# Patient Record
Sex: Female | Born: 1985 | Race: Black or African American | Hispanic: No | Marital: Single | Smoking: Former smoker
Health system: Southern US, Community
[De-identification: ages and names within clinical notes are randomized; demographics above are authoritative.]

## PROBLEM LIST (undated history)

## (undated) DIAGNOSIS — F419 Anxiety disorder, unspecified: Secondary | ICD-10-CM

---
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
## 2003-11-06 ENCOUNTER — Emergency Department (HOSPITAL_COMMUNITY): Admission: EM | Admit: 2003-11-06 | Discharge: 2003-11-06 | Payer: Self-pay | Admitting: Family Medicine

## 2004-01-25 ENCOUNTER — Emergency Department (HOSPITAL_COMMUNITY): Admission: EM | Admit: 2004-01-25 | Discharge: 2004-01-25 | Payer: Self-pay | Admitting: Emergency Medicine

## 2005-06-17 ENCOUNTER — Emergency Department (HOSPITAL_COMMUNITY): Admission: EM | Admit: 2005-06-17 | Discharge: 2005-06-17 | Payer: Self-pay | Admitting: Family Medicine

## 2005-07-17 ENCOUNTER — Emergency Department (HOSPITAL_COMMUNITY): Admission: EM | Admit: 2005-07-17 | Discharge: 2005-07-17 | Payer: Self-pay | Admitting: Family Medicine

## 2005-08-22 ENCOUNTER — Emergency Department (HOSPITAL_COMMUNITY): Admission: EM | Admit: 2005-08-22 | Discharge: 2005-08-22 | Payer: Self-pay | Admitting: Emergency Medicine

## 2005-09-11 ENCOUNTER — Emergency Department (HOSPITAL_COMMUNITY): Admission: EM | Admit: 2005-09-11 | Discharge: 2005-09-11 | Payer: Self-pay | Admitting: Family Medicine

## 2005-09-22 ENCOUNTER — Emergency Department (HOSPITAL_COMMUNITY): Admission: EM | Admit: 2005-09-22 | Discharge: 2005-09-22 | Payer: Self-pay | Admitting: Family Medicine

## 2006-06-01 ENCOUNTER — Emergency Department (HOSPITAL_COMMUNITY): Admission: EM | Admit: 2006-06-01 | Discharge: 2006-06-01 | Payer: Self-pay | Admitting: Emergency Medicine

## 2006-06-29 ENCOUNTER — Emergency Department (HOSPITAL_COMMUNITY): Admission: EM | Admit: 2006-06-29 | Discharge: 2006-06-29 | Payer: Self-pay | Admitting: Family Medicine

## 2006-08-03 ENCOUNTER — Emergency Department (HOSPITAL_COMMUNITY): Admission: EM | Admit: 2006-08-03 | Discharge: 2006-08-03 | Payer: Self-pay | Admitting: *Deleted

## 2007-04-12 ENCOUNTER — Emergency Department (HOSPITAL_COMMUNITY): Admission: EM | Admit: 2007-04-12 | Discharge: 2007-04-12 | Payer: Self-pay | Admitting: Emergency Medicine

## 2007-04-19 ENCOUNTER — Inpatient Hospital Stay (HOSPITAL_COMMUNITY): Admission: AD | Admit: 2007-04-19 | Discharge: 2007-04-19 | Payer: Self-pay | Admitting: Family Medicine

## 2007-07-01 HISTORY — PX: DILATION AND CURETTAGE OF UTERUS: SHX78

## 2008-05-21 ENCOUNTER — Emergency Department (HOSPITAL_COMMUNITY): Admission: EM | Admit: 2008-05-21 | Discharge: 2008-05-22 | Payer: Self-pay | Admitting: Emergency Medicine

## 2009-03-01 ENCOUNTER — Emergency Department (HOSPITAL_COMMUNITY): Admission: EM | Admit: 2009-03-01 | Discharge: 2009-03-01 | Payer: Self-pay | Admitting: Emergency Medicine

## 2009-07-15 ENCOUNTER — Emergency Department (HOSPITAL_COMMUNITY): Admission: EM | Admit: 2009-07-15 | Discharge: 2009-07-15 | Payer: Self-pay | Admitting: Emergency Medicine

## 2010-05-29 ENCOUNTER — Emergency Department (HOSPITAL_COMMUNITY)
Admission: EM | Admit: 2010-05-29 | Discharge: 2010-05-29 | Payer: Self-pay | Source: Home / Self Care | Admitting: Family Medicine

## 2010-07-21 ENCOUNTER — Encounter: Payer: Self-pay | Admitting: Emergency Medicine

## 2010-09-10 LAB — POCT URINALYSIS DIPSTICK
Ketones, ur: 15 mg/dL — AB
Protein, ur: 30 mg/dL — AB
Specific Gravity, Urine: 1.02 (ref 1.005–1.030)

## 2010-09-10 LAB — WET PREP, GENITAL: Yeast Wet Prep HPF POC: NONE SEEN

## 2010-09-10 LAB — POCT I-STAT, CHEM 8
BUN: 6 mg/dL (ref 6–23)
Calcium, Ion: 1.15 mmol/L (ref 1.12–1.32)
Chloride: 105 mEq/L (ref 96–112)
Glucose, Bld: 90 mg/dL (ref 70–99)

## 2010-09-10 LAB — POCT PREGNANCY, URINE: Preg Test, Ur: NEGATIVE

## 2010-09-10 LAB — GC/CHLAMYDIA PROBE AMP, GENITAL: GC Probe Amp, Genital: NEGATIVE

## 2010-09-10 LAB — RPR: RPR Ser Ql: NONREACTIVE

## 2010-09-23 ENCOUNTER — Inpatient Hospital Stay (INDEPENDENT_AMBULATORY_CARE_PROVIDER_SITE_OTHER)
Admission: RE | Admit: 2010-09-23 | Discharge: 2010-09-23 | Disposition: A | Discharge status: Home / Self Care | Payer: Self-pay | Source: Ambulatory Visit | Attending: Family Medicine | Admitting: Family Medicine

## 2010-09-23 DIAGNOSIS — K089 Disorder of teeth and supporting structures, unspecified: Secondary | ICD-10-CM

## 2010-09-23 DIAGNOSIS — N760 Acute vaginitis: Secondary | ICD-10-CM

## 2010-09-23 LAB — POCT URINALYSIS DIP (DEVICE)
Protein, ur: NEGATIVE mg/dL
Specific Gravity, Urine: 1.02 (ref 1.005–1.030)
Urobilinogen, UA: 0.2 mg/dL (ref 0.0–1.0)
pH: 6 (ref 5.0–8.0)

## 2010-09-23 LAB — WET PREP, GENITAL

## 2010-09-24 LAB — GC/CHLAMYDIA PROBE AMP, GENITAL: GC Probe Amp, Genital: NEGATIVE

## 2010-10-04 LAB — POCT I-STAT, CHEM 8
BUN: 8 mg/dL (ref 6–23)
Calcium, Ion: 1.15 mmol/L (ref 1.12–1.32)
HCT: 52 % — ABNORMAL HIGH (ref 36.0–46.0)
TCO2: 23 mmol/L (ref 0–100)

## 2010-10-04 LAB — POCT PREGNANCY, URINE: Preg Test, Ur: NEGATIVE

## 2010-10-04 LAB — TSH: TSH: 0.365 u[IU]/mL (ref 0.350–4.500)

## 2010-12-02 ENCOUNTER — Inpatient Hospital Stay (INDEPENDENT_AMBULATORY_CARE_PROVIDER_SITE_OTHER)
Admission: RE | Admit: 2010-12-02 | Discharge: 2010-12-02 | Disposition: A | Discharge status: Home / Self Care | Payer: Self-pay | Source: Ambulatory Visit | Attending: Family Medicine | Admitting: Family Medicine

## 2010-12-02 DIAGNOSIS — N946 Dysmenorrhea, unspecified: Secondary | ICD-10-CM

## 2010-12-02 LAB — POCT I-STAT, CHEM 8
Calcium, Ion: 1.2 mmol/L (ref 1.12–1.32)
Creatinine, Ser: 0.9 mg/dL (ref 0.4–1.2)
Hemoglobin: 19.4 g/dL — ABNORMAL HIGH (ref 12.0–15.0)
Sodium: 141 mEq/L (ref 135–145)
TCO2: 23 mmol/L (ref 0–100)

## 2010-12-02 LAB — POCT URINALYSIS DIP (DEVICE)
Glucose, UA: NEGATIVE mg/dL
Nitrite: NEGATIVE
Protein, ur: 30 mg/dL — AB
Urobilinogen, UA: 0.2 mg/dL (ref 0.0–1.0)

## 2010-12-04 LAB — URINE CULTURE

## 2011-02-01 ENCOUNTER — Inpatient Hospital Stay (INDEPENDENT_AMBULATORY_CARE_PROVIDER_SITE_OTHER)
Admission: RE | Admit: 2011-02-01 | Discharge: 2011-02-01 | Disposition: A | Discharge status: Home / Self Care | Payer: Self-pay | Source: Ambulatory Visit | Attending: Emergency Medicine | Admitting: Emergency Medicine

## 2011-02-01 DIAGNOSIS — K089 Disorder of teeth and supporting structures, unspecified: Secondary | ICD-10-CM

## 2011-02-01 DIAGNOSIS — K029 Dental caries, unspecified: Secondary | ICD-10-CM

## 2011-02-01 DIAGNOSIS — K122 Cellulitis and abscess of mouth: Secondary | ICD-10-CM

## 2011-02-01 DIAGNOSIS — K0510 Chronic gingivitis, plaque induced: Secondary | ICD-10-CM

## 2011-02-03 ENCOUNTER — Inpatient Hospital Stay (INDEPENDENT_AMBULATORY_CARE_PROVIDER_SITE_OTHER)
Admission: RE | Admit: 2011-02-03 | Discharge: 2011-02-03 | Disposition: A | Discharge status: Home / Self Care | Payer: Self-pay | Source: Ambulatory Visit | Attending: Emergency Medicine | Admitting: Emergency Medicine

## 2011-02-03 DIAGNOSIS — K047 Periapical abscess without sinus: Secondary | ICD-10-CM

## 2011-03-14 ENCOUNTER — Inpatient Hospital Stay (INDEPENDENT_AMBULATORY_CARE_PROVIDER_SITE_OTHER)
Admission: RE | Admit: 2011-03-14 | Discharge: 2011-03-14 | Disposition: A | Discharge status: Home / Self Care | Payer: Self-pay | Source: Ambulatory Visit | Attending: Emergency Medicine | Admitting: Emergency Medicine

## 2011-03-14 DIAGNOSIS — Z3201 Encounter for pregnancy test, result positive: Secondary | ICD-10-CM

## 2011-03-14 LAB — HCG, SERUM, QUALITATIVE: Preg, Serum: POSITIVE — AB

## 2011-03-17 LAB — POCT PREGNANCY, URINE: Preg Test, Ur: POSITIVE

## 2011-03-25 ENCOUNTER — Inpatient Hospital Stay (HOSPITAL_COMMUNITY): Payer: Self-pay

## 2011-03-25 ENCOUNTER — Encounter (HOSPITAL_COMMUNITY): Payer: Self-pay | Admitting: *Deleted

## 2011-03-25 ENCOUNTER — Inpatient Hospital Stay (HOSPITAL_COMMUNITY)
Admission: AD | Admit: 2011-03-25 | Discharge: 2011-03-25 | Disposition: A | Discharge status: Home / Self Care | Payer: Medicaid Other | Source: Ambulatory Visit | Attending: Obstetrics & Gynecology | Admitting: Obstetrics & Gynecology

## 2011-03-25 DIAGNOSIS — O26899 Other specified pregnancy related conditions, unspecified trimester: Secondary | ICD-10-CM

## 2011-03-25 DIAGNOSIS — R109 Unspecified abdominal pain: Secondary | ICD-10-CM | POA: Insufficient documentation

## 2011-03-25 DIAGNOSIS — O99891 Other specified diseases and conditions complicating pregnancy: Secondary | ICD-10-CM | POA: Insufficient documentation

## 2011-03-25 HISTORY — DX: Anxiety disorder, unspecified: F41.9

## 2011-03-25 LAB — URINALYSIS, ROUTINE W REFLEX MICROSCOPIC
Glucose, UA: NEGATIVE mg/dL
Leukocytes, UA: NEGATIVE
Nitrite: NEGATIVE
Protein, ur: NEGATIVE mg/dL
Urobilinogen, UA: 0.2 mg/dL (ref 0.0–1.0)

## 2011-03-25 NOTE — ED Provider Notes (Signed)
Attestation of Attending Supervision of Advanced Practitioner: Evaluation and management procedures were performed by the PA/NP/CNM/OB Fellow under my supervision/collaboration. Chart reviewed and agree with management and plan.  Rory Montel A 03/25/2011 2:59 PM

## 2011-03-25 NOTE — ED Provider Notes (Signed)
History   Pt presents today c/o lower abd cramping. She denies vag bleeding but states she has noticed worsening cramping over the past 2 days. She denies fever or any other problems at this time.  Chief Complaint  Patient presents with  . Abdominal Pain   HPI  OB History    Grav Para Term Preterm Abortions TAB SAB Ect Mult Living   2 0 0 0 1 0 1 0 0 0       Past Medical History  Diagnosis Date  . Anxiety     on celexa    Past Surgical History  Procedure Date  . Dilation and curettage of uterus 2009    No family history on file.  History  Substance Use Topics  . Smoking status: Former Games developer  . Smokeless tobacco: Not on file  . Alcohol Use: No    Allergies: No Known Allergies  Prescriptions prior to admission  Medication Sig Dispense Refill  . citalopram (CELEXA) 10 MG tablet Take 10 mg by mouth daily.        . prenatal vitamin w/FE, FA (PRENATAL 1 + 1) 27-1 MG TABS Take 1 tablet by mouth daily.          Review of Systems  Constitutional: Negative for fever.  Cardiovascular: Negative for chest pain.  Gastrointestinal: Positive for abdominal pain. Negative for nausea, vomiting, diarrhea and constipation.  Genitourinary: Negative for dysuria, urgency and frequency.  Neurological: Negative for dizziness and headaches.  Psychiatric/Behavioral: Negative for depression and suicidal ideas.   Physical Exam   Blood pressure 114/66, pulse 79, temperature 98.3 F (36.8 C), temperature source Oral, resp. rate 20, height 5\' 5"  (1.651 m), weight 161 lb 12.8 oz (73.392 kg).  Physical Exam  Constitutional: She is oriented to person, place, and time. She appears well-developed and well-nourished. No distress.  HENT:  Head: Normocephalic and atraumatic.  Eyes: EOM are normal. Pupils are equal, round, and reactive to light.  GI: Soft. She exhibits no distension. There is no tenderness. There is no rebound and no guarding.  Genitourinary: No bleeding around the vagina. No  vaginal discharge found.  Neurological: She is alert and oriented to person, place, and time.  Skin: Skin is warm and dry. She is not diaphoretic.  Psychiatric: She has a normal mood and affect. Her behavior is normal. Judgment and thought content normal.    MAU Course  Procedures  Results for orders placed during the hospital encounter of 03/25/11 (from the past 24 hour(s))  URINALYSIS, ROUTINE W REFLEX MICROSCOPIC     Status: Abnormal   Collection Time   03/25/11 12:50 PM      Component Value Range   Color, Urine YELLOW  YELLOW    Appearance CLOUDY (*) CLEAR    Specific Gravity, Urine 1.015  1.005 - 1.030    pH 7.5  5.0 - 8.0    Glucose, UA NEGATIVE  NEGATIVE (mg/dL)   Hgb urine dipstick NEGATIVE  NEGATIVE    Bilirubin Urine NEGATIVE  NEGATIVE    Ketones, ur NEGATIVE  NEGATIVE (mg/dL)   Protein, ur NEGATIVE  NEGATIVE (mg/dL)   Urobilinogen, UA 0.2  0.0 - 1.0 (mg/dL)   Nitrite NEGATIVE  NEGATIVE    Leukocytes, UA NEGATIVE  NEGATIVE    US shows single IUP with cardiac activity.  Assessment and Plan  Pain in preg: discussed with pt at length. She will begin prenatal care. Discussed diet, activity, risks, and precautions.  Clinton Gallant. Rice III, DrHSc, MPAS, PA-C  03/25/2011, 1:31 PM   Henrietta Hoover, PA 03/25/11 1405

## 2011-03-25 NOTE — Progress Notes (Signed)
preg confirmed at health dept  On 09/19.  Cramping started 2 days ago.

## 2011-04-01 LAB — DIFFERENTIAL
Basophils Relative: 0
Eosinophils Absolute: 0.2
Monocytes Relative: 7
Neutrophils Relative %: 66

## 2011-04-01 LAB — LIPASE, BLOOD: Lipase: 16

## 2011-04-01 LAB — COMPREHENSIVE METABOLIC PANEL
ALT: 14
Alkaline Phosphatase: 73
CO2: 30
Chloride: 104
GFR calc non Af Amer: >60
Glucose, Bld: 94
Potassium: 3.7
Sodium: 137
Total Bilirubin: 0.5

## 2011-04-01 LAB — URINALYSIS, ROUTINE W REFLEX MICROSCOPIC
Glucose, UA: NEGATIVE
Ketones, ur: NEGATIVE
Protein, ur: NEGATIVE

## 2011-04-01 LAB — URINE CULTURE

## 2011-04-01 LAB — CBC
Hemoglobin: 15.1 — ABNORMAL HIGH
RBC: 4.74

## 2011-04-09 LAB — URINALYSIS, ROUTINE W REFLEX MICROSCOPIC
Glucose, UA: NEGATIVE
Nitrite: NEGATIVE
Protein, ur: NEGATIVE
Urobilinogen, UA: 0.2

## 2011-04-09 LAB — URINE MICROSCOPIC-ADD ON

## 2011-04-09 LAB — WET PREP, GENITAL: Trich, Wet Prep: NONE SEEN

## 2011-04-09 LAB — CBC
HCT: 44.4
Hemoglobin: 15
MCHC: 33.8
MCV: 94.7
RDW: 12.9

## 2011-04-09 LAB — HCG, SERUM, QUALITATIVE: Preg, Serum: NEGATIVE

## 2011-04-17 LAB — OB RESULTS CONSOLE HEPATITIS B SURFACE ANTIGEN: Hepatitis B Surface Ag: NEGATIVE

## 2011-04-17 LAB — OB RESULTS CONSOLE ANTIBODY SCREEN: Antibody Screen: NEGATIVE

## 2011-07-01 NOTE — L&D Delivery Note (Signed)
Delivery Note At 11:25 AM a viable female was delivered via Vaginal, Spontaneous Delivery (Presentation: ; Occiput Anterior).  APGAR: 9, 9; weight P .   Placenta status: Intact, Spontaneous.  Cord: 3 vessels with the following complications: None.   Anesthesia: Epidural  Episiotomy: None Lacerations: 2nd degree;Periurethral;Perineal Suture Repair: 3.0 vicryl rapide Est. Blood Loss (mL): 500  Mom to postpartum.  Baby to nursery-stable.  [name],Markitta Ausburn 11/07/2011, 11:59 AM  Br/ O+

## 2011-10-17 LAB — OB RESULTS CONSOLE GBS: GBS: POSITIVE

## 2011-11-06 ENCOUNTER — Inpatient Hospital Stay (HOSPITAL_COMMUNITY)
Admission: AD | Admit: 2011-11-06 | Discharge: 2011-11-09 | DRG: 775 | Disposition: A | Discharge status: Home / Self Care | Payer: Medicaid Other | Source: Ambulatory Visit | Attending: Obstetrics and Gynecology | Admitting: Obstetrics and Gynecology

## 2011-11-06 DIAGNOSIS — Z22330 Carrier of Group B streptococcus: Secondary | ICD-10-CM

## 2011-11-06 DIAGNOSIS — O409XX0 Polyhydramnios, unspecified trimester, not applicable or unspecified: Principal | ICD-10-CM | POA: Diagnosis present

## 2011-11-06 DIAGNOSIS — IMO0001 Reserved for inherently not codable concepts without codable children: Secondary | ICD-10-CM

## 2011-11-06 DIAGNOSIS — O99892 Other specified diseases and conditions complicating childbirth: Secondary | ICD-10-CM | POA: Diagnosis present

## 2011-11-07 ENCOUNTER — Encounter (HOSPITAL_COMMUNITY): Payer: Self-pay | Admitting: Anesthesiology

## 2011-11-07 ENCOUNTER — Inpatient Hospital Stay (HOSPITAL_COMMUNITY): Payer: Medicaid Other | Admitting: Anesthesiology

## 2011-11-07 ENCOUNTER — Encounter (HOSPITAL_COMMUNITY): Payer: Self-pay

## 2011-11-07 DIAGNOSIS — IMO0001 Reserved for inherently not codable concepts without codable children: Secondary | ICD-10-CM

## 2011-11-07 DIAGNOSIS — O409XX0 Polyhydramnios, unspecified trimester, not applicable or unspecified: Secondary | ICD-10-CM | POA: Diagnosis present

## 2011-11-07 LAB — CBC
HCT: 40.8 % (ref 36.0–46.0)
MCHC: 33.3 g/dL (ref 30.0–36.0)
MCV: 96.2 fL (ref 78.0–100.0)
RDW: 13.2 % (ref 11.5–15.5)

## 2011-11-07 MED ORDER — ZOLPIDEM TARTRATE 5 MG PO TABS: 5.0000 mg | ORAL_TABLET | Freq: Every evening | ORAL | Status: DC | PRN

## 2011-11-07 MED ORDER — LANOLIN HYDROUS EX OINT: TOPICAL_OINTMENT | CUTANEOUS | Status: DC | PRN

## 2011-11-07 MED ORDER — PHENYLEPHRINE 40 MCG/ML (10ML) SYRINGE FOR IV PUSH (FOR BLOOD PRESSURE SUPPORT)
80.0000 ug | PREFILLED_SYRINGE | INTRAVENOUS | Status: DC | PRN
  Administered 2011-11-07: 120 ug via INTRAVENOUS

## 2011-11-07 MED ORDER — ONDANSETRON HCL 4 MG PO TABS: 4.0000 mg | ORAL_TABLET | ORAL | Status: DC | PRN

## 2011-11-07 MED ORDER — EPHEDRINE 5 MG/ML INJ
10.0000 mg | INTRAVENOUS | Status: DC | PRN
  Administered 2011-11-07: 10 mg via INTRAVENOUS

## 2011-11-07 MED ORDER — SENNOSIDES-DOCUSATE SODIUM 8.6-50 MG PO TABS
2.0000 | ORAL_TABLET | Freq: Every day | ORAL | Status: DC
  Administered 2011-11-07 – 2011-11-08 (×2): 2 via ORAL

## 2011-11-07 MED ORDER — DIPHENHYDRAMINE HCL 50 MG/ML IJ SOLN: 12.5000 mg | INTRAMUSCULAR | Status: DC | PRN

## 2011-11-07 MED ORDER — FAMOTIDINE 20 MG PO TABS
20.0000 mg | ORAL_TABLET | Freq: Two times a day (BID) | ORAL | Status: DC
  Administered 2011-11-07 – 2011-11-09 (×4): 20 mg via ORAL
  Filled 2011-11-07 (×4): qty 1

## 2011-11-07 MED ORDER — LACTATED RINGERS IV SOLN
INTRAVENOUS | Status: DC
  Administered 2011-11-07: 1000 mL via INTRAVENOUS

## 2011-11-07 MED ORDER — CITRIC ACID-SODIUM CITRATE 334-500 MG/5ML PO SOLN: 30.0000 mL | ORAL | Status: DC | PRN

## 2011-11-07 MED ORDER — LIDOCAINE HCL (PF) 1 % IJ SOLN
30.0000 mL | INTRAMUSCULAR | Status: DC | PRN
  Filled 2011-11-07: qty 30

## 2011-11-07 MED ORDER — IBUPROFEN 600 MG PO TABS
600.0000 mg | ORAL_TABLET | Freq: Four times a day (QID) | ORAL | Status: DC
  Administered 2011-11-07 – 2011-11-09 (×8): 600 mg via ORAL
  Filled 2011-11-07 (×8): qty 1

## 2011-11-07 MED ORDER — OXYTOCIN 20 UNITS IN LACTATED RINGERS INFUSION - SIMPLE
125.0000 mL/h | Freq: Once | INTRAVENOUS | Status: AC
  Administered 2011-11-07: 125 mL/h via INTRAVENOUS

## 2011-11-07 MED ORDER — ONDANSETRON HCL 4 MG/2ML IJ SOLN
4.0000 mg | Freq: Four times a day (QID) | INTRAMUSCULAR | Status: DC | PRN
  Administered 2011-11-07: 4 mg via INTRAVENOUS
  Filled 2011-11-07: qty 2

## 2011-11-07 MED ORDER — EPHEDRINE 5 MG/ML INJ
10.0000 mg | INTRAVENOUS | Status: DC | PRN
  Filled 2011-11-07: qty 4

## 2011-11-07 MED ORDER — DIBUCAINE 1 % RE OINT: 1.0000 "application " | TOPICAL_OINTMENT | RECTAL | Status: DC | PRN

## 2011-11-07 MED ORDER — ONDANSETRON HCL 4 MG/2ML IJ SOLN: 4.0000 mg | INTRAMUSCULAR | Status: DC | PRN

## 2011-11-07 MED ORDER — FENTANYL 2.5 MCG/ML BUPIVACAINE 1/10 % EPIDURAL INFUSION (WH - ANES)
INTRAMUSCULAR | Status: DC | PRN
  Administered 2011-11-07: 8 mL/h via EPIDURAL

## 2011-11-07 MED ORDER — PRENATAL PLUS 27-1 MG PO TABS: 1.0000 | ORAL_TABLET | Freq: Every day | ORAL | Status: DC

## 2011-11-07 MED ORDER — LACTATED RINGERS IV SOLN: 500.0000 mL | INTRAVENOUS | Status: DC | PRN

## 2011-11-07 MED ORDER — BUTORPHANOL TARTRATE 2 MG/ML IJ SOLN
1.0000 mg | Freq: Once | INTRAMUSCULAR | Status: DC
  Filled 2011-11-07: qty 1

## 2011-11-07 MED ORDER — LACTATED RINGERS IV SOLN
500.0000 mL | Freq: Once | INTRAVENOUS | Status: AC
  Administered 2011-11-07: 500 mL via INTRAVENOUS

## 2011-11-07 MED ORDER — OXYTOCIN BOLUS FROM INFUSION
500.0000 mL | Freq: Once | INTRAVENOUS | Status: DC
  Filled 2011-11-07: qty 500
  Filled 2011-11-07: qty 1000

## 2011-11-07 MED ORDER — FLEET ENEMA 7-19 GM/118ML RE ENEM: 1.0000 | ENEMA | RECTAL | Status: DC | PRN

## 2011-11-07 MED ORDER — DIPHENHYDRAMINE HCL 25 MG PO CAPS: 25.0000 mg | ORAL_CAPSULE | Freq: Four times a day (QID) | ORAL | Status: DC | PRN

## 2011-11-07 MED ORDER — FENTANYL 2.5 MCG/ML BUPIVACAINE 1/10 % EPIDURAL INFUSION (WH - ANES)
14.0000 mL/h | INTRAMUSCULAR | Status: DC
  Administered 2011-11-07 (×2): 14 mL/h via EPIDURAL
  Filled 2011-11-07 (×4): qty 60

## 2011-11-07 MED ORDER — ACETAMINOPHEN 325 MG PO TABS: 650.0000 mg | ORAL_TABLET | ORAL | Status: DC | PRN

## 2011-11-07 MED ORDER — PHENYLEPHRINE 40 MCG/ML (10ML) SYRINGE FOR IV PUSH (FOR BLOOD PRESSURE SUPPORT)
80.0000 ug | PREFILLED_SYRINGE | INTRAVENOUS | Status: DC | PRN
  Filled 2011-11-07: qty 5

## 2011-11-07 MED ORDER — BENZOCAINE-MENTHOL 20-0.5 % EX AERO
1.0000 "application " | INHALATION_SPRAY | CUTANEOUS | Status: DC | PRN
  Filled 2011-11-07: qty 56

## 2011-11-07 MED ORDER — ONDANSETRON HCL 4 MG PO TABS: 4.0000 mg | ORAL_TABLET | Freq: Three times a day (TID) | ORAL | Status: DC | PRN

## 2011-11-07 MED ORDER — OXYCODONE-ACETAMINOPHEN 5-325 MG PO TABS: 1.0000 | ORAL_TABLET | ORAL | Status: DC | PRN

## 2011-11-07 MED ORDER — IBUPROFEN 600 MG PO TABS: 600.0000 mg | ORAL_TABLET | Freq: Four times a day (QID) | ORAL | Status: DC | PRN

## 2011-11-07 MED ORDER — OXYCODONE-ACETAMINOPHEN 5-325 MG PO TABS
1.0000 | ORAL_TABLET | ORAL | Status: DC | PRN
  Administered 2011-11-07 – 2011-11-09 (×2): 1 via ORAL
  Filled 2011-11-07 (×2): qty 1

## 2011-11-07 MED ORDER — SIMETHICONE 80 MG PO CHEW: 80.0000 mg | CHEWABLE_TABLET | ORAL | Status: DC | PRN

## 2011-11-07 MED ORDER — TETANUS-DIPHTH-ACELL PERTUSSIS 5-2.5-18.5 LF-MCG/0.5 IM SUSP
0.5000 mL | Freq: Once | INTRAMUSCULAR | Status: AC
  Administered 2011-11-08: 0.5 mL via INTRAMUSCULAR
  Filled 2011-11-07: qty 0.5

## 2011-11-07 MED ORDER — PENICILLIN G POTASSIUM 5000000 UNITS IJ SOLR
2.5000 10*6.[IU] | INTRAVENOUS | Status: DC
  Administered 2011-11-07 (×2): 2.5 10*6.[IU] via INTRAVENOUS
  Filled 2011-11-07 (×7): qty 2.5

## 2011-11-07 MED ORDER — LACTATED RINGERS IV SOLN: INTRAVENOUS | Status: DC

## 2011-11-07 MED ORDER — PENICILLIN G POTASSIUM 5000000 UNITS IJ SOLR
5.0000 10*6.[IU] | Freq: Once | INTRAMUSCULAR | Status: AC
  Administered 2011-11-07: 5 10*6.[IU] via INTRAVENOUS
  Filled 2011-11-07: qty 5

## 2011-11-07 MED ORDER — WITCH HAZEL-GLYCERIN EX PADS: 1.0000 "application " | MEDICATED_PAD | CUTANEOUS | Status: DC | PRN

## 2011-11-07 MED ORDER — PRENATAL MULTIVITAMIN CH
1.0000 | ORAL_TABLET | Freq: Every day | ORAL | Status: DC
  Administered 2011-11-08 – 2011-11-09 (×2): 1 via ORAL
  Filled 2011-11-07 (×2): qty 1

## 2011-11-07 NOTE — Progress Notes (Signed)
Patient ID: Becky Dunlap, female   DOB: March 03, 1986, 26 y.o.   MRN: [id number] Pt complete with pressure.  Have started pushing. 130-140, reactive, some variable with contractions ctxq 2-3 min  Expect SVD

## 2011-11-07 NOTE — Anesthesia Procedure Notes (Signed)
Epidural Patient location during procedure: OB Start time: 11/07/2011 1:28 AM End time: 11/07/2011 1:32 AM Reason for block: procedure for pain  Staffing Anesthesiologist: Sandrea Hughs  Preanesthetic Checklist Completed: patient identified, site marked, surgical consent, pre-op evaluation, timeout performed, IV checked, risks and benefits discussed and monitors and equipment checked  Epidural Patient position: sitting Prep: site prepped and draped and DuraPrep Patient monitoring: continuous pulse ox and blood pressure Approach: midline Injection technique: LOR air  Needle:  Needle type: Tuohy  Needle gauge: 17 G Needle length: 9 cm Needle insertion depth: 5 cm cm Catheter type: closed end flexible Catheter size: 19 Gauge Catheter at skin depth: 10 cm Test dose: negative and Other  Assessment Sensory level: T10 Events: blood not aspirated, injection not painful, no injection resistance, negative IV test and no paresthesia

## 2011-11-07 NOTE — H&P (Signed)
Becky Dunlap is a 26 y.o. female presenting for ctx.  On eval in MAU cervix was 4 cm dilated, changed from 1 cm in office yesterday afternoon.  Pt admitted, has received an epidural, had SROM at about 0450 with light meconium.  Prenatal care complicated by trich x 3 Rxed with Flagyl, polyhydramnios followed with reactive NSTs.  See prenatal records for complete history.  Maternal Medical History:  Reason for admission: Reason for admission: contractions.  Contractions: Frequency: regular.   Perceived severity is strong.    Fetal activity: Perceived fetal activity is normal.    Prenatal complications: Polyhydramnios.     OB History    Grav Para Term Preterm Abortions TAB SAB Ect Mult Living   2 0 0 0 1 0 1 0 0 0      PMH- Anxiety, nephrolithiasis  Past Surgical History  Procedure Date  . Dilation and curettage of uterus 2009   Family History: family history is negative for Anesthesia problems. Social History:  reports that she has quit smoking. She does not have any smokeless tobacco history on file. She reports that she does not drink alcohol or use illicit drugs.  Review of Systems  Respiratory: Negative.   Cardiovascular: Negative.    AROM forebag with light meconium Dilation: 6 Effacement (%): 80 Station: -1 Exam by:: Modupe Shampine Blood pressure 123/74, pulse 115, temperature 98.5 F (36.9 C), temperature source Oral, resp. rate 20, height 5\' 5"  (1.651 m), weight 86.183 kg (190 lb), SpO2 100.00%. Maternal Exam:  Uterine Assessment: Contraction strength is firm.  Contraction frequency is regular.   Abdomen: Patient reports no abdominal tenderness. Estimated fetal weight is 8 lbs.   Fetal presentation: vertex  Introitus: Normal vulva. Normal vagina.  Amniotic fluid character: meconium stained.  Pelvis: adequate for delivery.   Cervix: Cervix evaluated by digital exam.     Fetal Exam Fetal Monitor Review: Mode: ultrasound.   Baseline rate: 140.  Variability:  moderate (6-25 bpm).   Pattern: variable decelerations and accelerations present.    Fetal State Assessment: Category II - tracings are indeterminate.     Physical Exam  Constitutional: She appears well-developed and well-nourished.  Cardiovascular: Normal rate, regular rhythm and normal heart sounds.   No murmur heard. Respiratory: Effort normal and breath sounds normal. No respiratory distress.  GI: Soft.       Gravid     Prenatal labs: ABO, Rh:  O pos Antibody: Negative (10/18 0000) Rubella: Immune (10/18 0000) RPR: Nonreactive (10/18 0000)  HBsAg: Negative (10/18 0000)  HIV: Non-reactive (10/18 0000)  GBS: Positive (04/19 0000)   Assessment/Plan: IUP at 39+ weeks in active labor, light meconium, GBS positive, h/o polyhydramnios.  Comfortable with epidural, monitor progress, continue PCN for +GBS.     Kerwin Augustus D 11/07/2011, 7:09 AM

## 2011-11-07 NOTE — Progress Notes (Signed)
UR Chart review completed.  

## 2011-11-07 NOTE — Progress Notes (Signed)
Becky Dunlap is a 26 y.o. G2P0010 at [redacted]w[redacted]d by admitted for active labor  Subjective: comf with epidural, some pressure  Objective: BP 133/73  Pulse 139  Temp(Src) 98.1 F (36.7 C) (Oral)  Resp 18  Ht 5\' 5"  (1.651 m)  Wt 86.183 kg (190 lb)  BMI 31.62 kg/m2  SpO2 100%      FHT:  FHR: 140's bpm, variability: moderate,  accelerations:  Present,  decelerations:  Absent UC:   regular, every 3-4 minutes SVE:   Dilation: 9 Effacement (%): 100 Station: +1;+2 Exam by:: dr. Ellyn Hack  Labs: Lab Results  Component Value Date   WBC 13.5* 11/07/2011   HGB 13.6 11/07/2011   HCT 40.8 11/07/2011   MCV 96.2 11/07/2011   PLT 246 11/07/2011    Assessment / Plan: Spontaneous labor, progressing normally  Labor: Progressing normally Preeclampsia:  no signs or symptoms of toxicity Fetal Wellbeing:  Category I Pain Control:  Epidural I/D:  n/a Anticipated MOD:  NSVD Will recheck soon and likely start pushing soon.   [name],Taesha Goodell 11/07/2011, 10:07 AM

## 2011-11-07 NOTE — Progress Notes (Signed)
Reports vaginal pressure during contractions.

## 2011-11-07 NOTE — MAU Note (Signed)
Contractions, denies bleeding or ROM 

## 2011-11-07 NOTE — Anesthesia Preprocedure Evaluation (Signed)
Anesthesia Evaluation  Patient identified by MRN, date of birth, ID band Patient awake    Reviewed: Allergy & Precautions, H&P , NPO status , Patient's Chart, lab work & pertinent test results  Airway Mallampati: II TM Distance: >3 FB Neck ROM: full    Dental No notable dental hx.    Pulmonary neg pulmonary ROS,    Pulmonary exam normal       Cardiovascular negative cardio ROS      Neuro/Psych negative neurological ROS  negative psych ROS   GI/Hepatic negative GI ROS, Neg liver ROS,   Endo/Other    Renal/GU negative Renal ROS  negative genitourinary   Musculoskeletal negative musculoskeletal ROS (+)   Abdominal Normal abdominal exam  (+)   Peds negative pediatric ROS (+)  Hematology negative hematology ROS (+)   Anesthesia Other Findings   Reproductive/Obstetrics (+) Pregnancy                           Anesthesia Physical Anesthesia Plan  ASA: II  Anesthesia Plan: Epidural   Post-op Pain Management:    Induction:   Airway Management Planned:   Additional Equipment:   Intra-op Plan:   Post-operative Plan:   Informed Consent: I have reviewed the patients History and Physical, chart, labs and discussed the procedure including the risks, benefits and alternatives for the proposed anesthesia with the patient or authorized representative who has indicated his/her understanding and acceptance.     Plan Discussed with:   Anesthesia Plan Comments:         Anesthesia Quick Evaluation

## 2011-11-08 ENCOUNTER — Encounter (HOSPITAL_COMMUNITY): Payer: Self-pay

## 2011-11-08 ENCOUNTER — Encounter (HOSPITAL_COMMUNITY): Payer: Self-pay | Admitting: *Deleted

## 2011-11-08 LAB — CBC
Hemoglobin: 10.6 g/dL — ABNORMAL LOW (ref 12.0–15.0)
MCH: 31.9 pg (ref 26.0–34.0)
MCV: 96.4 fL (ref 78.0–100.0)
RBC: 3.32 MIL/uL — ABNORMAL LOW (ref 3.87–5.11)
WBC: 18.2 10*3/uL — ABNORMAL HIGH (ref 4.0–10.5)

## 2011-11-08 NOTE — Anesthesia Postprocedure Evaluation (Signed)
Anesthesia Post Note  Patient: Becky Dunlap  Procedure(s) Performed: * No procedures listed *  Anesthesia type: Epidural  Patient location: Mother/Baby  Post pain: Pain level controlled  Post assessment: Post-op Vital signs reviewed  Last Vitals:  Filed Vitals:   11/08/11 0238  BP: 102/68  Pulse: 96  Temp: 37.1 C  Resp: 18    Post vital signs: Reviewed  Level of consciousness: awake  Complications: No apparent anesthesia complications

## 2011-11-08 NOTE — Anesthesia Postprocedure Evaluation (Signed)
  Anesthesia Post-op Note  Patient: Becky Dunlap  Procedure(s) Performed: * No surgery found *  Patient Location: PACU and Mother/Baby  Anesthesia Type: Epidural  Level of Consciousness: awake, alert  and oriented  Airway and Oxygen Therapy: Patient Spontanous Breathing  Post-op Pain: none  Post-op Assessment: Post-op Vital signs reviewed  Post-op Vital Signs: Reviewed and stable  Complications: No apparent anesthesia complications

## 2011-11-08 NOTE — Progress Notes (Signed)
Post Partum Day 1 Subjective: no complaints, up ad lib, tolerating PO and nl lochia, pain controlled  Objective: Blood pressure 107/70, pulse 85, temperature 98.1 F (36.7 C), temperature source Oral, resp. rate 16, height 5\' 5"  (1.651 m), weight 86.183 kg (190 lb), SpO2 98.00%, unknown if currently breastfeeding.  Physical Exam:  General: alert and no distress Lochia: appropriate Uterine Fundus: firm   Basename 11/08/11 0533 11/07/11 0105  HGB 10.6* 13.6  HCT 32.0* 40.8    Assessment/Plan: Plan for discharge tomorrow, Breastfeeding and Lactation consult   LOS: 2 days   [name],Jhoselyn Ruffini 11/08/2011, 12:33 PM

## 2011-11-09 ENCOUNTER — Encounter (HOSPITAL_COMMUNITY): Payer: Self-pay | Admitting: *Deleted

## 2011-11-09 MED ORDER — OXYCODONE-ACETAMINOPHEN 5-325 MG PO TABS: 1.0000 | ORAL_TABLET | Freq: Four times a day (QID) | ORAL | Status: AC | PRN

## 2011-11-09 MED ORDER — IBUPROFEN 800 MG PO TABS: 800.0000 mg | ORAL_TABLET | Freq: Three times a day (TID) | ORAL | Status: AC | PRN

## 2011-11-09 MED ORDER — PRENATAL PLUS 27-1 MG PO TABS: 1.0000 | ORAL_TABLET | Freq: Every day | ORAL | Status: DC

## 2011-11-09 NOTE — Discharge Summary (Signed)
Obstetric Discharge Summary Reason for Admission: onset of labor Prenatal Procedures: none Intrapartum Procedures: spontaneous vaginal delivery Postpartum Procedures: none Complications-Operative and Postpartum: vaginal and labial laceration Hemoglobin  Date Value Range Status  11/08/2011 10.6* 12.0-15.0 (g/dL) Final     REPEATED TO VERIFY     DELTA CHECK NOTED     HCT  Date Value Range Status  11/08/2011 32.0* 36.0-46.0 (%) Final    Physical Exam:  General: alert and no distress Lochia: appropriate Uterine Fundus: firm   Discharge Diagnoses: Term Pregnancy-delivered  Discharge Information: Date: 11/09/2011 Activity: pelvic rest Diet: routine Medications: PNV, Ibuprofen and Percocet Condition: stable Instructions: refer to practice specific booklet Discharge to: home Follow-up Information    Follow up with Dunlap,Becky Windom, MD. Schedule an appointment as soon as possible for a visit in 6 weeks.   Contact information:   [address] [facility] [address] [place] [address] [phone]          Newborn Data: Live born female  Birth Weight: 6 lb 15.3 oz (3155 g) APGAR: 9, 9  Home with mother.  Dunlap,Becky Corpus 11/09/2011, 11:26 AM

## 2011-11-09 NOTE — Progress Notes (Signed)
Post Partum Day 2 Subjective: no complaints, up ad lib, tolerating PO and nl llochia, pain controlled  Objective: Blood pressure 107/72, pulse 89, temperature 97.6 F (36.4 C), temperature source Oral, resp. rate 18, height 5\' 5"  (1.651 m), weight 86.183 kg (190 lb), SpO2 98.00%, unknown if currently breastfeeding.  Physical Exam:  General: alert and no distress Lochia: appropriate Uterine Fundus: firm   Basename 11/08/11 0533 11/07/11 0105  HGB 10.6* 13.6  HCT 32.0* 40.8    Assessment/Plan: Discharge home, Breastfeeding and Lactation consult D/c home with motrin/percocet/pnv, f/u 6 weeks.  Nexplanon for contraception  LOS: 3 days   [name],Loyce Klasen 11/09/2011, 11:20 AM

## 2012-09-08 ENCOUNTER — Ambulatory Visit: Payer: Self-pay | Admitting: Family Medicine

## 2012-10-14 ENCOUNTER — Emergency Department: Payer: Self-pay | Admitting: Emergency Medicine

## 2012-10-25 ENCOUNTER — Emergency Department: Payer: Self-pay | Admitting: Emergency Medicine

## 2012-10-26 ENCOUNTER — Emergency Department: Payer: Self-pay | Admitting: Unknown Physician Specialty

## 2012-10-26 LAB — CBC WITH DIFFERENTIAL/PLATELET
Eosinophil %: 2 %
HCT: 40.9 % (ref 35.0–47.0)
Lymphocyte #: 1.4 10*3/uL (ref 1.0–3.6)
MCHC: 33.7 g/dL (ref 32.0–36.0)
Monocyte #: 0.8 x10 3/mm (ref 0.2–0.9)
Monocyte %: 7.3 %
Neutrophil #: 7.9 10*3/uL — ABNORMAL HIGH (ref 1.4–6.5)

## 2012-10-26 LAB — BASIC METABOLIC PANEL
Calcium, Total: 8.5 mg/dL (ref 8.5–10.1)
Chloride: 109 mmol/L — ABNORMAL HIGH (ref 98–107)
Creatinine: 0.66 mg/dL (ref 0.60–1.30)
EGFR (African American): >60
EGFR (Non-African Amer.): >60
Osmolality: 276 (ref 275–301)
Potassium: 4 mmol/L (ref 3.5–5.1)

## 2013-02-19 ENCOUNTER — Telehealth: Payer: Self-pay | Admitting: Family Medicine

## 2013-02-19 NOTE — Telephone Encounter (Signed)
Entered in error

## 2013-02-25 ENCOUNTER — Emergency Department: Payer: Self-pay | Admitting: Emergency Medicine

## 2013-02-25 LAB — CBC
HCT: 45.4 % (ref 35.0–47.0)
HGB: 15.5 g/dL (ref 12.0–16.0)
MCH: 32.3 pg (ref 26.0–34.0)
MCHC: 34.2 g/dL (ref 32.0–36.0)
MCV: 94 fL (ref 80–100)
RBC: 4.81 10*6/uL (ref 3.80–5.20)

## 2013-07-04 ENCOUNTER — Emergency Department: Payer: Self-pay | Admitting: Emergency Medicine

## 2013-07-04 LAB — URINALYSIS, COMPLETE
BLOOD: NEGATIVE
Bacteria: NONE SEEN
Bilirubin,UR: NEGATIVE
GLUCOSE, UR: NEGATIVE mg/dL (ref 0–75)
LEUKOCYTE ESTERASE: NEGATIVE
NITRITE: NEGATIVE
PH: 6 (ref 4.5–8.0)
Protein: 75
SPECIFIC GRAVITY: 1.02 (ref 1.003–1.030)

## 2013-07-04 LAB — RAPID INFLUENZA A&B ANTIGENS

## 2013-07-19 ENCOUNTER — Emergency Department (INDEPENDENT_AMBULATORY_CARE_PROVIDER_SITE_OTHER)
Admission: EM | Admit: 2013-07-19 | Discharge: 2013-07-19 | Disposition: A | Discharge status: Home / Self Care | Payer: Self-pay | Source: Home / Self Care | Attending: Family Medicine | Admitting: Family Medicine

## 2013-07-19 ENCOUNTER — Encounter (HOSPITAL_COMMUNITY): Payer: Self-pay | Admitting: Emergency Medicine

## 2013-07-19 DIAGNOSIS — K047 Periapical abscess without sinus: Secondary | ICD-10-CM

## 2013-07-19 DIAGNOSIS — K044 Acute apical periodontitis of pulpal origin: Secondary | ICD-10-CM

## 2013-07-19 MED ORDER — CEFTRIAXONE SODIUM 1 G IJ SOLR
1.0000 g | Freq: Once | INTRAMUSCULAR | Status: AC
Start: 2013-07-19 — End: 2013-07-19
  Administered 2013-07-19: 1 g via INTRAMUSCULAR

## 2013-07-19 MED ORDER — TRAMADOL HCL 50 MG PO TABS: 50.0000 mg | ORAL_TABLET | Freq: Four times a day (QID) | ORAL | Status: DC | PRN

## 2013-07-19 MED ORDER — CLINDAMYCIN HCL 300 MG PO CAPS: 300.0000 mg | ORAL_CAPSULE | Freq: Four times a day (QID) | ORAL | Status: DC

## 2013-07-19 MED ORDER — CEFTRIAXONE SODIUM 1 G IJ SOLR
INTRAMUSCULAR | Status: AC
  Filled 2013-07-19: qty 10

## 2013-07-19 MED ORDER — HYDROCODONE-ACETAMINOPHEN 5-325 MG PO TABS
ORAL_TABLET | ORAL | Status: AC
  Filled 2013-07-19: qty 2

## 2013-07-19 MED ORDER — HYDROCODONE-ACETAMINOPHEN 5-325 MG PO TABS
2.0000 | ORAL_TABLET | Freq: Once | ORAL | Status: AC
  Administered 2013-07-19: 2 via ORAL

## 2013-07-19 MED ORDER — LIDOCAINE HCL (PF) 1 % IJ SOLN
INTRAMUSCULAR | Status: AC
  Filled 2013-07-19: qty 5

## 2013-07-19 NOTE — Discharge Instructions (Signed)
Thank you for coming in today. Please get both the clindamycin antibiotics and tramadol for pain. Try to see a dentist as soon as possible. If not getting rapidly better or worsening go directly to the emergency room.   Please call Dr. Lawrence Marseillesivils office [phone] or cell [phone] [address], [place]  Cost for tooth removal $200 includes exam, Xray, and extraction and follow up visit.  Bring list of current medications with you.   Please call [facility] at [phone] to get the details to get your tooth pulled.    Dental Abscess A dental abscess is a collection of infected fluid (pus) from a bacterial infection in the inner part of the tooth (pulp). It usually occurs at the end of the tooth's root.  CAUSES   Severe tooth decay.  Trauma to the tooth that allows bacteria to enter into the pulp, such as a broken or chipped tooth. SYMPTOMS   Severe pain in and around the infected tooth.  Swelling and redness around the abscessed tooth or in the mouth or face.  Tenderness.  Pus drainage.  Bad breath.  Bitter taste in the mouth.  Difficulty swallowing.  Difficulty opening the mouth.  Nausea.  Vomiting.  Chills.  Swollen neck glands. DIAGNOSIS   A medical and dental history will be taken.  An examination will be performed by tapping on the abscessed tooth.  X-rays may be taken of the tooth to identify the abscess. TREATMENT The goal of treatment is to eliminate the infection. You may be prescribed antibiotic medicine to stop the infection from spreading. A root canal may be performed to save the tooth. If the tooth cannot be saved, it may be pulled (extracted) and the abscess may be drained.  HOME CARE INSTRUCTIONS  Only take over-the-counter or prescription medicines for pain, fever, or discomfort as directed by your caregiver.  Rinse your mouth (gargle) often with salt water ( tsp salt in 8 oz [250 ml] of warm water) to relieve pain or  swelling.  Do not drive after taking pain medicine (narcotics).  Do not apply heat to the outside of your face.  Return to your dentist for further treatment as directed. SEEK MEDICAL CARE IF:  Your pain is not helped by medicine.  Your pain is getting worse instead of better. SEEK IMMEDIATE MEDICAL CARE IF:  You have a fever or persistent symptoms for more than 2 3 days.  You have a fever and your symptoms suddenly get worse.  You have chills or a very bad headache.  You have problems breathing or swallowing.  You have trouble opening your mouth.  You have swelling in the neck or around the eye. Document Released: 06/16/2005 Document Revised: 03/10/2012 Document Reviewed: 09/24/2010 [facility] Patient Information 2014 [employer] [place].

## 2013-07-19 NOTE — ED Notes (Signed)
C/o of rt tooth pain that has been going on for 2 days. Pain is  9/10. Has some drainage. Stated she had the flu a week ago and the pain and swelling began. States every time she gets sick, the swelling comes back soon after. State she has tried home remedies with no relief. Written by: Marga MelnickQuaNeisha Jones, SMA

## 2013-07-19 NOTE — ED Notes (Signed)
Patient called stating she could not afford  Clindamycin Spoke with Dr Artis FlockKindl called in new prescription for amoxicillin 500mg  TID #30

## 2013-07-19 NOTE — ED Provider Notes (Signed)
Becky Dunlap is a 28 y.o. female who presents to Urgent Care today for right jaw swelling. This is been present since yesterday. It is worsening and becoming painful. She denies any trouble breathing or swallowing. Pain is worse with chewing. She has tried ibuprofen and Tylenol which helped. She does not have an appointment with a dentist. She denies any fevers or chills nausea vomiting or diarrhea.   Past Medical History  Diagnosis Date  . Anxiety     on celexa  . SVD (spontaneous vaginal delivery) 11/07/2011   History  Substance Use Topics  . Smoking status: Former Games developermoker  . Smokeless tobacco: Not on file  . Alcohol Use: No   ROS as above Medications: Current Facility-Administered Medications  Medication Dose Route Frequency Provider Last Rate Last Dose  . cefTRIAXone (ROCEPHIN) injection 1 g  1 g Intramuscular Once Rodolph BongEvan S Lidia Clavijo, MD      . HYDROcodone-acetaminophen (NORCO/VICODIN) 5-325 MG per tablet 2 tablet  2 tablet Oral Once Rodolph BongEvan S Rinda Rollyson, MD       Current Outpatient Prescriptions  Medication Sig Dispense Refill  . cimetidine (CIMETIDINE 200) 200 MG tablet Take 200 mg by mouth 4 (four) times daily.      . clindamycin (CLEOCIN) 300 MG capsule Take 1 capsule (300 mg total) by mouth 4 (four) times daily.  40 capsule  0  . prenatal vitamin w/FE, FA (PRENATAL 1 + 1) 27-1 MG TABS Take 1 tablet by mouth daily.  30 each  12  . traMADol (ULTRAM) 50 MG tablet Take 1 tablet (50 mg total) by mouth every 6 (six) hours as needed.  20 tablet  0    Exam:  BP 115/73  Pulse 80  Temp(Src) 97.5 F (36.4 C) (Oral)  Resp 18  LMP 07/11/2013 Gen: Well NAD HEENT: EOMI,  MMM, right mandible swelling tender to palpation nonfluctuant. Normal-appearing teeth. No tongue or posterior pharynx swelling. Lungs: Normal work of breathing. CTABL Heart: RRR no MRG Abd: NABS, Soft. NT, ND Exts: Brisk capillary refill, warm and well perfused.    Assessment and Plan: 28 y.o. female with dental  infection. Plan to treat with IM ceftriaxone injection, oral clindamycin, and tramadol for pain control. Referred to dentist. Followup as needed.  Discussed warning signs or symptoms. Please see discharge instructions. Patient expresses understanding.    Rodolph BongEvan S Yvonnia Tango, MD 07/19/13 1255

## 2013-07-20 ENCOUNTER — Emergency Department: Payer: Self-pay | Admitting: Emergency Medicine

## 2013-07-21 ENCOUNTER — Ambulatory Visit: Payer: Self-pay | Admitting: Otolaryngology

## 2013-07-21 LAB — COMPREHENSIVE METABOLIC PANEL
Albumin: 3.4 g/dL (ref 3.4–5.0)
Alkaline Phosphatase: 107 U/L
Anion Gap: 4 — ABNORMAL LOW (ref 7–16)
BILIRUBIN TOTAL: 0.5 mg/dL (ref 0.2–1.0)
BUN: 9 mg/dL (ref 7–18)
CALCIUM: 8.9 mg/dL (ref 8.5–10.1)
CHLORIDE: 104 mmol/L (ref 98–107)
Co2: 26 mmol/L (ref 21–32)
Creatinine: 0.76 mg/dL (ref 0.60–1.30)
EGFR (African American): >60
GLUCOSE: 86 mg/dL (ref 65–99)
Osmolality: 266 (ref 275–301)
Potassium: 3.8 mmol/L (ref 3.5–5.1)
SGOT(AST): 17 U/L (ref 15–37)
SGPT (ALT): 14 U/L (ref 12–78)
SODIUM: 134 mmol/L — AB (ref 136–145)
Total Protein: 7.7 g/dL (ref 6.4–8.2)

## 2013-07-21 LAB — CBC WITH DIFFERENTIAL/PLATELET
BASOS ABS: 0.1 10*3/uL (ref 0.0–0.1)
Basophil %: 0.6 %
EOS PCT: 1.5 %
Eosinophil #: 0.2 10*3/uL (ref 0.0–0.7)
HCT: 43.5 % (ref 35.0–47.0)
HGB: 14.2 g/dL (ref 12.0–16.0)
LYMPHS PCT: 16.3 %
Lymphocyte #: 2 10*3/uL (ref 1.0–3.6)
MCH: 31.1 pg (ref 26.0–34.0)
MCHC: 32.6 g/dL (ref 32.0–36.0)
MCV: 95 fL (ref 80–100)
Monocyte #: 1 x10 3/mm — ABNORMAL HIGH (ref 0.2–0.9)
Monocyte %: 7.9 %
Neutrophil #: 9 10*3/uL — ABNORMAL HIGH (ref 1.4–6.5)
Neutrophil %: 73.7 %
Platelet: 373 10*3/uL (ref 150–440)
RBC: 4.57 10*6/uL (ref 3.80–5.20)
RDW: 12.8 % (ref 11.5–14.5)
WBC: 12.2 10*3/uL — AB (ref 3.6–11.0)

## 2013-07-24 LAB — WOUND CULTURE

## 2013-07-26 LAB — CULTURE, BLOOD (SINGLE)

## 2013-09-22 ENCOUNTER — Emergency Department: Payer: Self-pay | Admitting: Emergency Medicine

## 2013-09-22 LAB — URINALYSIS, COMPLETE
BILIRUBIN, UR: NEGATIVE
Glucose,UR: NEGATIVE mg/dL (ref 0–75)
KETONE: NEGATIVE
Nitrite: NEGATIVE
PH: 5 (ref 4.5–8.0)
SPECIFIC GRAVITY: 1.018 (ref 1.003–1.030)
Squamous Epithelial: 5
Transitional Epi: <1

## 2013-09-22 LAB — PREGNANCY, URINE: Pregnancy Test, Urine: NEGATIVE m[IU]/mL

## 2013-09-22 LAB — COMPREHENSIVE METABOLIC PANEL
ALBUMIN: 3.1 g/dL — AB (ref 3.4–5.0)
ALT: 17 U/L (ref 12–78)
Alkaline Phosphatase: 105 U/L
Anion Gap: 4 — ABNORMAL LOW (ref 7–16)
BUN: 8 mg/dL (ref 7–18)
Bilirubin,Total: 0.7 mg/dL (ref 0.2–1.0)
CHLORIDE: 106 mmol/L (ref 98–107)
CO2: 27 mmol/L (ref 21–32)
Calcium, Total: 8.8 mg/dL (ref 8.5–10.1)
Creatinine: 0.85 mg/dL (ref 0.60–1.30)
EGFR (Non-African Amer.): >60
GLUCOSE: 90 mg/dL (ref 65–99)
Osmolality: 272 (ref 275–301)
POTASSIUM: 4 mmol/L (ref 3.5–5.1)
SGOT(AST): 18 U/L (ref 15–37)
SODIUM: 137 mmol/L (ref 136–145)
TOTAL PROTEIN: 7.8 g/dL (ref 6.4–8.2)

## 2013-09-22 LAB — CBC WITH DIFFERENTIAL/PLATELET
BASOS ABS: 0 10*3/uL (ref 0.0–0.1)
Basophil %: 0.4 %
EOS ABS: 0.1 10*3/uL (ref 0.0–0.7)
Eosinophil %: 0.8 %
HCT: 41.9 % (ref 35.0–47.0)
HGB: 13.8 g/dL (ref 12.0–16.0)
LYMPHS ABS: 1.3 10*3/uL (ref 1.0–3.6)
Lymphocyte %: 14.5 %
MCH: 31.8 pg (ref 26.0–34.0)
MCHC: 33.1 g/dL (ref 32.0–36.0)
MCV: 96 fL (ref 80–100)
MONO ABS: 1.4 x10 3/mm — AB (ref 0.2–0.9)
Monocyte %: 15.6 %
NEUTROS PCT: 68.7 %
Neutrophil #: 6 10*3/uL (ref 1.4–6.5)
Platelet: 229 10*3/uL (ref 150–440)
RBC: 4.35 10*6/uL (ref 3.80–5.20)
RDW: 14 % (ref 11.5–14.5)
WBC: 8.5 10*3/uL (ref 3.6–11.0)

## 2013-09-22 LAB — TROPONIN I: Troponin-I: <0.02 ng/mL

## 2014-01-04 ENCOUNTER — Emergency Department: Payer: Self-pay | Admitting: Emergency Medicine

## 2014-01-04 LAB — URINALYSIS, COMPLETE
BILIRUBIN, UR: NEGATIVE
Bacteria: NONE SEEN
Blood: NEGATIVE
Glucose,UR: NEGATIVE mg/dL (ref 0–75)
Ketone: NEGATIVE
LEUKOCYTE ESTERASE: NEGATIVE
Nitrite: NEGATIVE
PH: 5 (ref 4.5–8.0)
Protein: NEGATIVE
Specific Gravity: 1.029 (ref 1.003–1.030)
Squamous Epithelial: 1

## 2014-01-04 LAB — CBC WITH DIFFERENTIAL/PLATELET
BASOS PCT: 0.8 %
Basophil #: 0.1 10*3/uL (ref 0.0–0.1)
EOS PCT: 2.4 %
Eosinophil #: 0.2 10*3/uL (ref 0.0–0.7)
HCT: 45.2 % (ref 35.0–47.0)
HGB: 15.1 g/dL (ref 12.0–16.0)
LYMPHS ABS: 2.5 10*3/uL (ref 1.0–3.6)
Lymphocyte %: 34.4 %
MCH: 32.7 pg (ref 26.0–34.0)
MCHC: 33.4 g/dL (ref 32.0–36.0)
MCV: 98 fL (ref 80–100)
MONOS PCT: 9.5 %
Monocyte #: 0.7 x10 3/mm (ref 0.2–0.9)
NEUTROS ABS: 3.9 10*3/uL (ref 1.4–6.5)
NEUTROS PCT: 52.9 %
PLATELETS: 300 10*3/uL (ref 150–440)
RBC: 4.61 10*6/uL (ref 3.80–5.20)
RDW: 13.6 % (ref 11.5–14.5)
WBC: 7.3 10*3/uL (ref 3.6–11.0)

## 2014-01-04 LAB — COMPREHENSIVE METABOLIC PANEL
ALBUMIN: 3.6 g/dL (ref 3.4–5.0)
ALK PHOS: 97 U/L
ALT: 20 U/L (ref 12–78)
Anion Gap: 9 (ref 7–16)
BILIRUBIN TOTAL: 0.4 mg/dL (ref 0.2–1.0)
BUN: 11 mg/dL (ref 7–18)
CALCIUM: 8.6 mg/dL (ref 8.5–10.1)
Chloride: 104 mmol/L (ref 98–107)
Co2: 26 mmol/L (ref 21–32)
Creatinine: 0.85 mg/dL (ref 0.60–1.30)
EGFR (African American): >60
EGFR (Non-African Amer.): >60
Glucose: 82 mg/dL (ref 65–99)
OSMOLALITY: 276 (ref 275–301)
Potassium: 3.6 mmol/L (ref 3.5–5.1)
SGOT(AST): 10 U/L — ABNORMAL LOW (ref 15–37)
Sodium: 139 mmol/L (ref 136–145)
TOTAL PROTEIN: 7.5 g/dL (ref 6.4–8.2)

## 2014-01-04 LAB — LIPASE, BLOOD: Lipase: 134 U/L (ref 73–393)

## 2014-01-04 LAB — TROPONIN I: Troponin-I: <0.02 ng/mL

## 2014-05-01 ENCOUNTER — Encounter (HOSPITAL_COMMUNITY): Payer: Self-pay | Admitting: Emergency Medicine

## 2014-05-01 IMAGING — US ABDOMEN ULTRASOUND LIMITED
1 series · 14 of 25 positions shown · non-contrast
Comparison: None.

CLINICAL DATA: Right upper quadrant discomfort

EXAM:
US ABDOMEN LIMITED - RIGHT UPPER QUADRANT

[Series 1: abdomen ultrasound limited · 0.36mm/px · 14 of 45 slices shown]
[im 1/45]
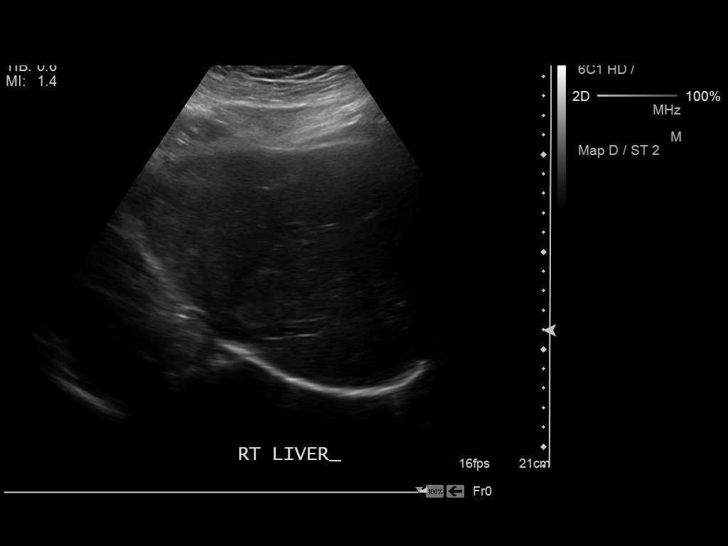
[im 4/45]
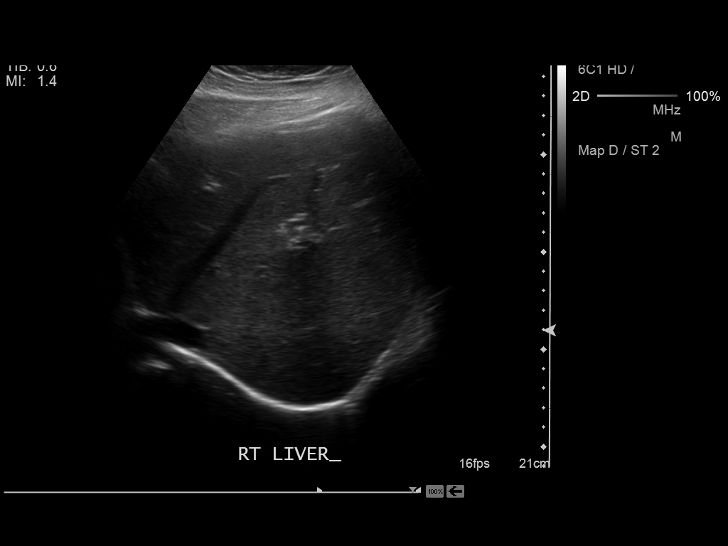
[im 8/45]
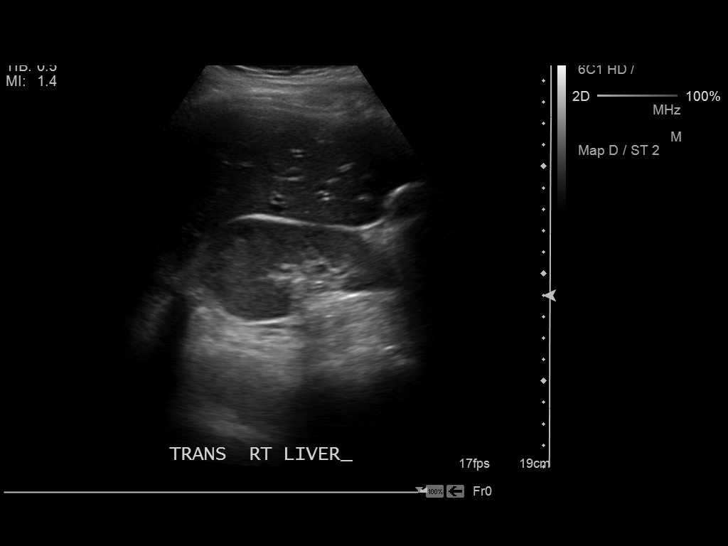
[im 12/45]
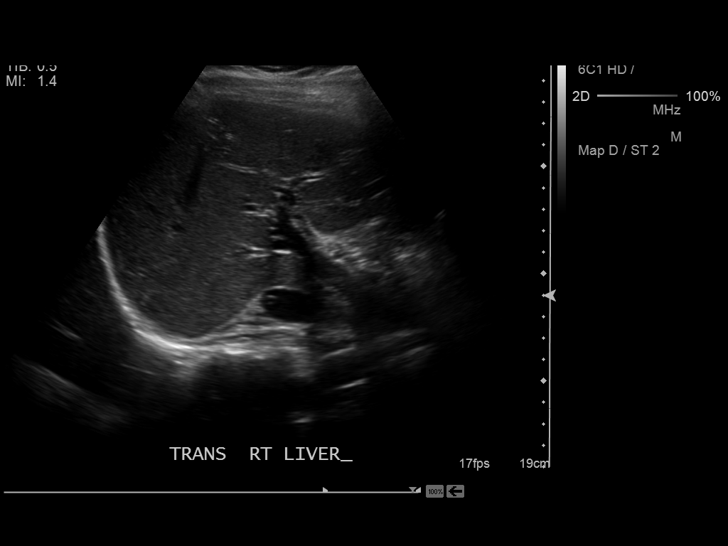
[im 15/45]
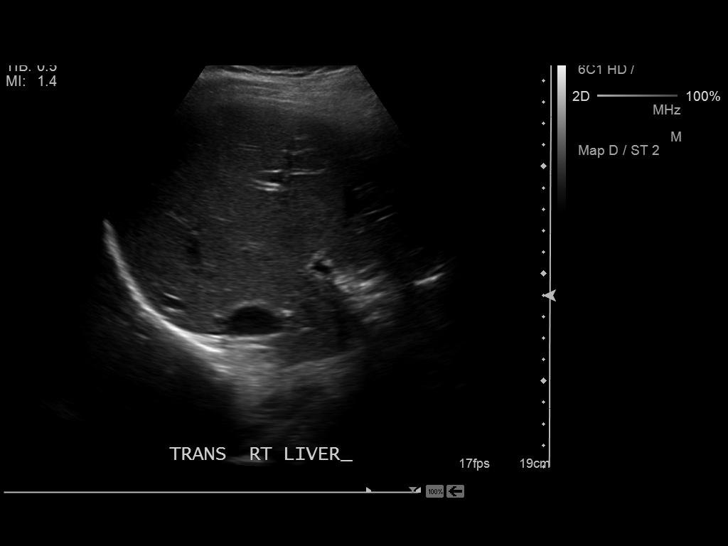
[im 17/45]
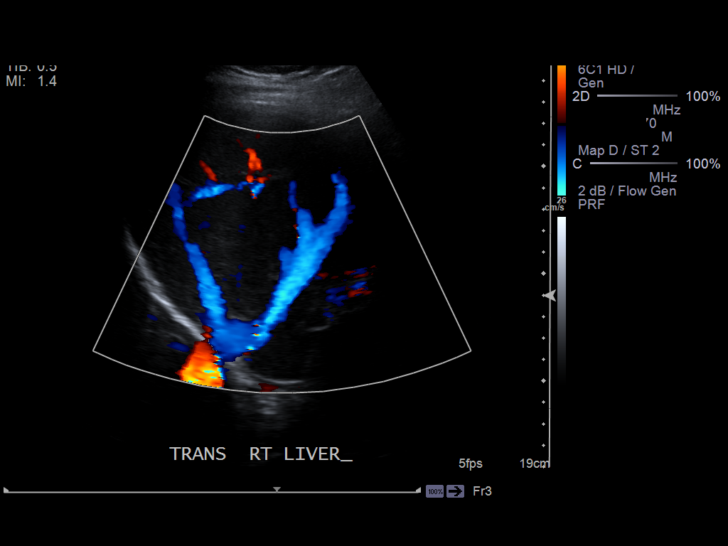
[im 21/45]
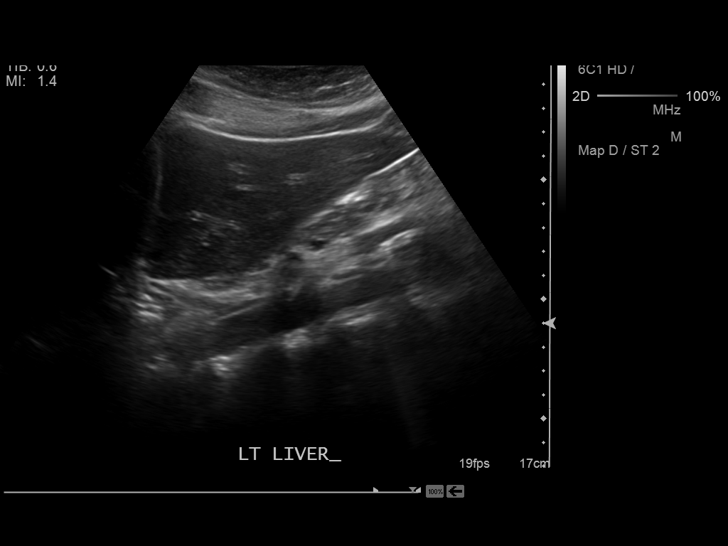
[im 24/45]
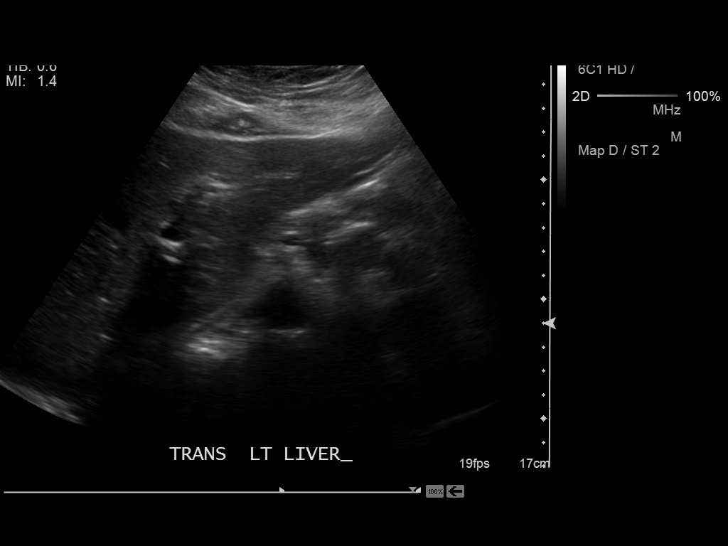
[im 28/45]
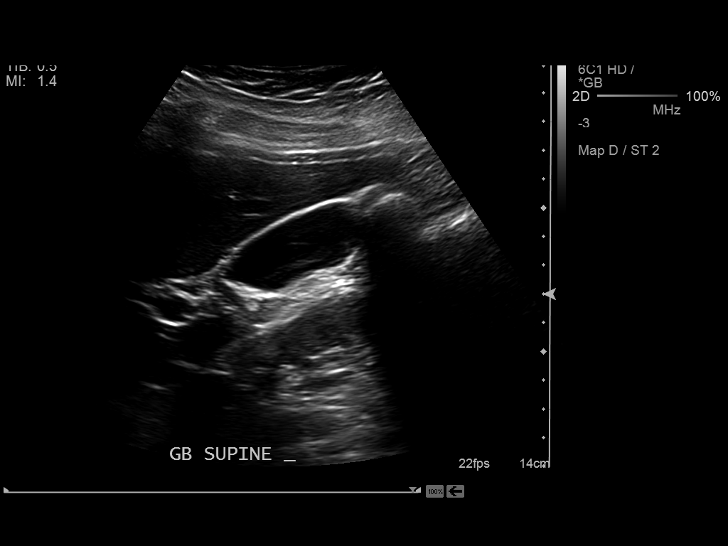
[im 30/45]
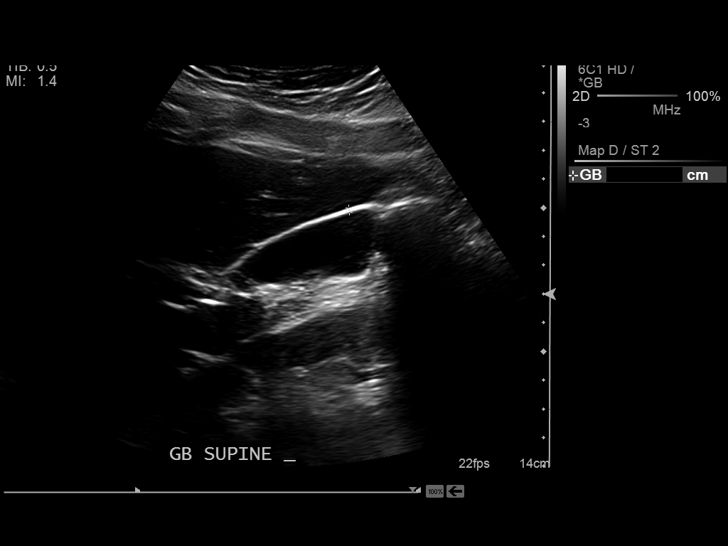
[im 34/45]
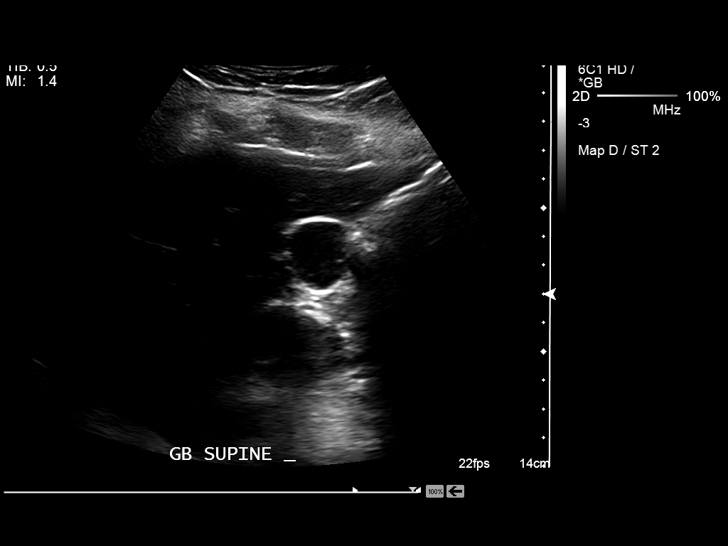
[im 37/45]
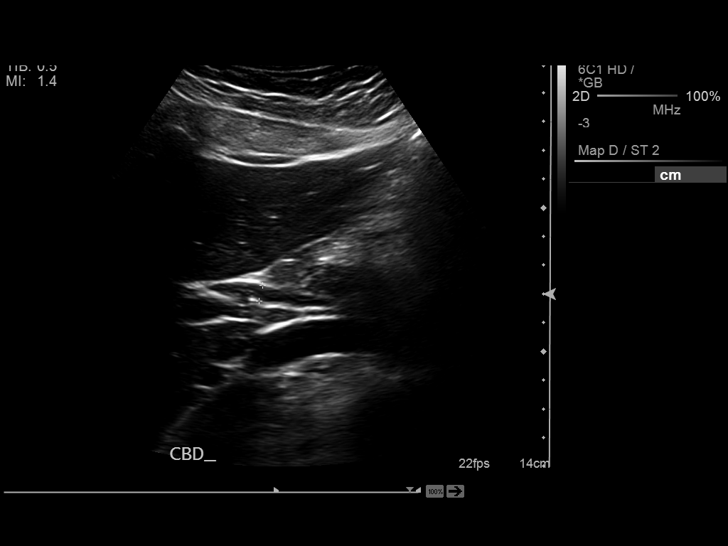
[im 41/45]
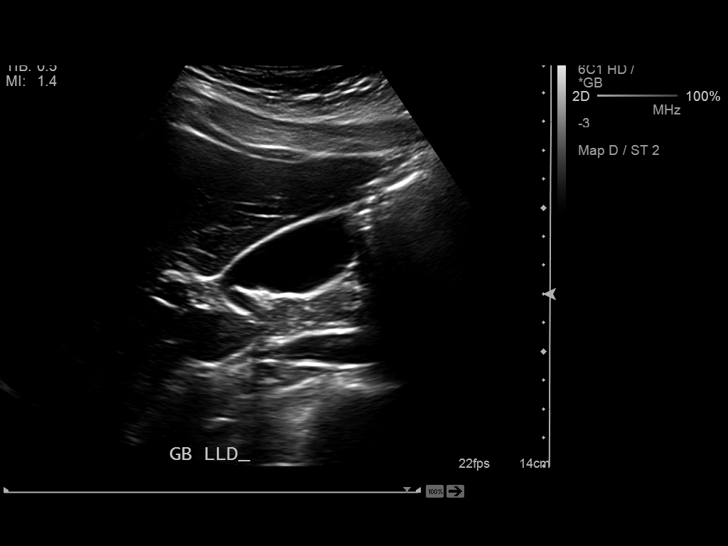
[im 45/45]
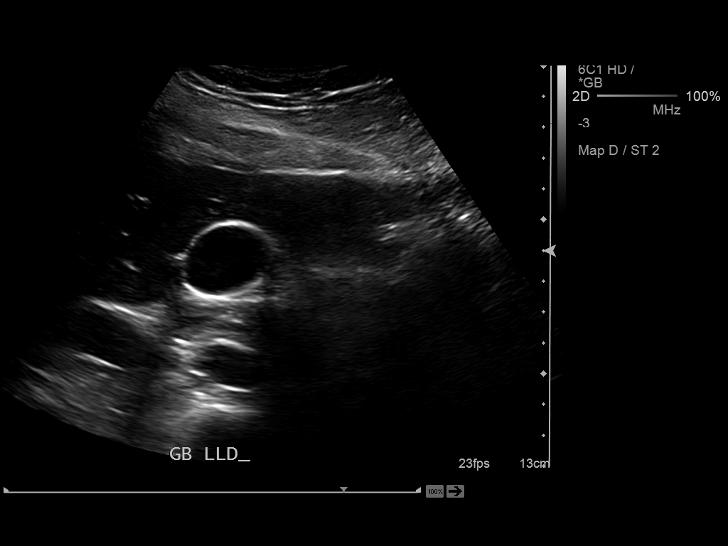

[14 of 25 positions shown; findings below may reference images not displayed]

FINDINGS: Gallbladder:

No gallstones or wall thickening visualized. No sonographic Murphy
sign noted.

Common bile duct:

Diameter: 5.6 mm

Liver:

No focal lesion identified. Within normal limits in parenchymal
echogenicity.
IMPRESSION: Normal limited right upper quadrant ultrasound. There is no evidence
of cholelithiasis or cholecystitis.

## 2014-10-21 NOTE — Consult Note (Signed)
PATIENT NAME:  Becky Dunlap, Becky Dunlap MR#:  [id number] DATE OF BIRTH:  Oct 09, 1985  DATE OF CONSULTATION:  07/21/2013  REQUESTING PHYSICIAN:  Dr. Margarita GrizzleWoodruff. CONSULTING PHYSICIAN:  Zackery BarefootJ. Madison Raquell Richer, MD  HISTORY OF PRESENT ILLNESS: The patient is a 29 year old African American female who was seen at her dentist today for a right tooth abscess. The patient underwent a Panorex and was sent home because of increased swelling on amoxicillin and tramadol. She reports that this became dramatically worse over the past hour and she drove herself to the Emergency Room. On presentation to the Emergency Room she was actively stridulous but it was felt that this may be at least partly anxiety related. With reassurance, she was able to calm down to the point where she would stop  the stridor, maintain her saturations at nearly 100% throughout the entire time,   ALLERGIES: None.   MEDICATIONS: Amoxicillin, tramadol.   PAST MEDICAL HISTORY: Gestational diabetes.   PAST SURGICAL HISTORY: Negative.   SOCIAL HISTORY: The patient quit smoking three months ago.   PHYSICAL EXAMINATION: GENERAL: Moderately anxious African American female with a large obvious right cheek abscess.  ORAL CAVITY AND OROPHARYNX: The patient has trismus with opening of approximately 5 mm. The teeth are in poor repair.  NOSE: The nares are patent. Septum is midline. Inferior turbinates are moderately hypertrophied. Flexible fiber-optic laryngoscopy reveals normal nasopharynx without masses or lesions at the fossa Rosenmuller. Oropharynx is clear. Very minimal edema in the right lateral pharynx. The vallecula, tongue base, endolarynx were all clear. There is no external compression of the larynx. The vocal cords abduct and adduct normally. There are no other endolaryngeal masses or lesions.  NECK: There is tender adenopathy in levels 2 through 4 and 5 on the right.   IMPRESSION: Large right facial abscess secondary to dental abscess. I have  recommended incision and drainage of this. I reassured the patient that her airway is stable and that we will be able to drain the abscess, but she will ultimately need to be evaluated again by her dentist for either extraction of the tooth or whatever treatment he recommends to treat the nidus of the infection. The patient will be taken to the Operating Room emergently and I have discussed this with the patient's father Elisha Headland(Michael Harrelson).   ____________________________ Shela CommonsJ. Gertie BaronMadison Yostin Malacara, MD jmc:sg D: 07/21/2013 13:34:00 ET T: 07/21/2013 14:08:27 ET JOB#: [id number]  cc: Zackery BarefootJ. Madison Terriyah Westra, MD, <Dictator> [facility] - Practice Administrator  Wendee CoppJMADISON Maeleigh Buschman MD ELECTRONICALLY SIGNED 07/31/2013 10:03

## 2014-10-21 NOTE — Op Note (Signed)
PATIENT NAME:  [name], Becky Dunlap DATE OF BIRTH:  02/13/1986  DATE OF PROCEDURE:  07/21/2013  SURGEON:  Zackery BarefootJ. Madison Ismael Treptow, MD  PREOPERATIVE DIAGNOSIS:  Right gingival labial dental abscess from tooth #32.  POSTOPERATIVE DIAGNOSIS:  Right gingival labial dental abscess from tooth #32.  PROCEDURE:  Incision and drainage right buccal abscess from tooth #32.   DESCRIPTION OF PROCEDURE: The patient was placed in supine position on the operating room table. After general endotracheal anesthesia had been induced, the patient was turned 90 degrees clockwise from anesthesia. The oral cavity and oropharynx were examined carefully. The abscess was determined to be located at tooth #32, which was significantly eroded primarily mesially and lingually. The abscess, however, was located in the gingival labial sulcus and buccal space. Using 0.5% lidocaine and 0.25% bupivacaine mixed 1:150,000 with epinephrine, the incision was made at the right gingival labial sulcus immediately adjacent to tooth #32. Copious yellow pus was encountered and this was cultured with a culture swab. The pus was suctioned and 500 mL of saline was used to copiously irrigate the cavity which was opened to avoid any loculations and make sure that the entire abscess was drained. The oropharynx was then suctioned clear. The patient was then returned to anesthesia, allowed to emerge from anesthesia in the operating room, and taken to the recovery room in stable condition. There were no complications. Estimated blood loss 5 mL.    ____________________________ J. Gertie BaronMadison Garmon Dehn, MD jmc:ce D: 07/21/2013 16:12:38 ET T: 07/21/2013 17:47:23 ET JOB#: [id number]  cc: Zackery BarefootJ. Madison Lola Lofaro, MD, <Dictator> Wendee CoppJMADISON Commodore Bellew MD ELECTRONICALLY SIGNED 07/31/2013 10:04

## 2015-04-29 ENCOUNTER — Emergency Department: Payer: Medicaid Other

## 2015-04-29 ENCOUNTER — Encounter: Payer: Self-pay | Admitting: Emergency Medicine

## 2015-04-29 ENCOUNTER — Emergency Department
Admission: EM | Admit: 2015-04-29 | Discharge: 2015-04-29 | Disposition: A | Discharge status: Home / Self Care | Payer: Medicaid Other | Attending: Emergency Medicine | Admitting: Emergency Medicine

## 2015-04-29 DIAGNOSIS — Z79899 Other long term (current) drug therapy: Secondary | ICD-10-CM | POA: Insufficient documentation

## 2015-04-29 DIAGNOSIS — Z3202 Encounter for pregnancy test, result negative: Secondary | ICD-10-CM | POA: Insufficient documentation

## 2015-04-29 DIAGNOSIS — Z87891 Personal history of nicotine dependence: Secondary | ICD-10-CM | POA: Diagnosis not present

## 2015-04-29 DIAGNOSIS — R51 Headache: Secondary | ICD-10-CM | POA: Insufficient documentation

## 2015-04-29 DIAGNOSIS — K0889 Other specified disorders of teeth and supporting structures: Secondary | ICD-10-CM | POA: Insufficient documentation

## 2015-04-29 DIAGNOSIS — Z792 Long term (current) use of antibiotics: Secondary | ICD-10-CM | POA: Insufficient documentation

## 2015-04-29 DIAGNOSIS — H53149 Visual discomfort, unspecified: Secondary | ICD-10-CM | POA: Diagnosis not present

## 2015-04-29 DIAGNOSIS — R112 Nausea with vomiting, unspecified: Secondary | ICD-10-CM | POA: Insufficient documentation

## 2015-04-29 DIAGNOSIS — R519 Headache, unspecified: Secondary | ICD-10-CM

## 2015-04-29 LAB — BASIC METABOLIC PANEL
ANION GAP: 4 — AB (ref 5–15)
BUN: 11 mg/dL (ref 6–20)
CALCIUM: 9 mg/dL (ref 8.9–10.3)
CO2: 27 mmol/L (ref 22–32)
Chloride: 109 mmol/L (ref 101–111)
Creatinine, Ser: 0.92 mg/dL (ref 0.44–1.00)
GFR calc Af Amer: >60 mL/min (ref >60)
GFR calc non Af Amer: >60 mL/min (ref >60)
GLUCOSE: 84 mg/dL (ref 65–99)
Potassium: 3.8 mmol/L (ref 3.5–5.1)
Sodium: 140 mmol/L (ref 135–145)

## 2015-04-29 LAB — CBC WITH DIFFERENTIAL/PLATELET
BASOS PCT: 1 %
Basophils Absolute: 0.1 10*3/uL (ref 0–0.1)
Eosinophils Absolute: 0.1 10*3/uL (ref 0–0.7)
Eosinophils Relative: 2 %
HEMATOCRIT: 43.3 % (ref 35.0–47.0)
Hemoglobin: 14.2 g/dL (ref 12.0–16.0)
LYMPHS PCT: 20 %
Lymphs Abs: 1.7 10*3/uL (ref 1.0–3.6)
MCH: 31.1 pg (ref 26.0–34.0)
MCHC: 32.7 g/dL (ref 32.0–36.0)
MCV: 94.9 fL (ref 80.0–100.0)
MONO ABS: 0.6 10*3/uL (ref 0.2–0.9)
MONOS PCT: 7 %
NEUTROS ABS: 6.3 10*3/uL (ref 1.4–6.5)
Neutrophils Relative %: 70 %
Platelets: 316 10*3/uL (ref 150–440)
RBC: 4.56 MIL/uL (ref 3.80–5.20)
RDW: 13.2 % (ref 11.5–14.5)
WBC: 8.8 10*3/uL (ref 3.6–11.0)

## 2015-04-29 LAB — PROTIME-INR
INR: 0.95
Prothrombin Time: 12.9 seconds (ref 11.4–15.0)

## 2015-04-29 LAB — POCT PREGNANCY, URINE: Preg Test, Ur: NEGATIVE

## 2015-04-29 MED ORDER — SODIUM CHLORIDE 0.9 % IV BOLUS (SEPSIS)
1000.0000 mL | Freq: Once | INTRAVENOUS | Status: AC
  Administered 2015-04-29: 1000 mL via INTRAVENOUS

## 2015-04-29 MED ORDER — ONDANSETRON HCL 4 MG/2ML IJ SOLN
4.0000 mg | Freq: Once | INTRAMUSCULAR | Status: AC
  Administered 2015-04-29: 4 mg via INTRAVENOUS
  Filled 2015-04-29: qty 2

## 2015-04-29 MED ORDER — KETOROLAC TROMETHAMINE 30 MG/ML IJ SOLN
30.0000 mg | Freq: Once | INTRAMUSCULAR | Status: AC
  Administered 2015-04-29: 30 mg via INTRAVENOUS
  Filled 2015-04-29: qty 1

## 2015-04-29 MED ORDER — AMOXICILLIN 500 MG PO CAPS: 500.0000 mg | ORAL_CAPSULE | Freq: Three times a day (TID) | ORAL | Status: DC

## 2015-04-29 MED ORDER — ONDANSETRON 4 MG PO TBDP
4.0000 mg | ORAL_TABLET | Freq: Once | ORAL | Status: AC
  Administered 2015-04-29: 4 mg via ORAL
  Filled 2015-04-29: qty 1

## 2015-04-29 MED ORDER — TRAMADOL HCL 50 MG PO TABS: 50.0000 mg | ORAL_TABLET | Freq: Four times a day (QID) | ORAL | Status: DC | PRN

## 2015-04-29 NOTE — ED Notes (Signed)
Pt states her lower jaw and head are hurting. Pt states her vision is blurry and she is seeing double. Pt points to right temple and states this is the worst headache she has ever had. Denies nausea and vomiting. Pt is moaning and crying.

## 2015-04-29 NOTE — Discharge Instructions (Signed)
Begin taking amoxicillin for the dental pain. Tramadol as needed for pain. Call tomorrow for an appointment with your dentist.

## 2015-04-29 NOTE — ED Provider Notes (Signed)
[facility] Emergency Department Provider Note  ____________________________________________  Time seen: Approximately 3:09 PM  I have reviewed the triage vital signs and the nursing notes.   HISTORY  Chief Complaint Dental Pain   HPI Becky Dunlap is a 29 y.o. female is here with complaint of left jaw and head hurting. Patient states this started out as a toothache and then developed into a headache. Patient states that headache is at her right temple with nausea and photophobia. Patient states she was driving at the time of the onset of headache. Currently she has nausea with some vomiting. She denies any visual problems other than photophobia. She denies any upper respiratory symptoms, pharyngitis, chest pain, or cough.Headache is constant. She denies any previous history of headaches. Currently her pain is 10 out of 10. 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 Patient is very emotional at this time.   Past Medical History  Diagnosis Date  . Anxiety     on celexa  . SVD (spontaneous vaginal delivery) 11/07/2011    Patient Active Problem List   Diagnosis Date Noted  . SVD (spontaneous vaginal delivery) 11/07/2011    Past Surgical History  Procedure Laterality Date  . Dilation and curettage of uterus  2009    Current Outpatient Rx  Name  Route  Sig  Dispense  Refill  . amoxicillin (AMOXIL) 500 MG capsule   Oral   Take 1 capsule (500 mg total) by mouth 3 (three) times daily.   30 capsule   0   . cimetidine (CIMETIDINE 200) 200 MG tablet   Oral   Take 200 mg by mouth 4 (four) times daily.         . clindamycin (CLEOCIN) 300 MG capsule   Oral   Take 1 capsule (300 mg total) by mouth 4 (four) times daily.   40 capsule   0   . prenatal vitamin w/FE, FA (PRENATAL 1 + 1) 27-1 MG TABS   Oral   Take 1 tablet by mouth daily.   30 each   12   . traMADol (ULTRAM) 50 MG tablet   Oral   Take 1 tablet (50 mg  total) by mouth every 6 (six) hours as needed.   20 tablet   0     Allergies Review of patient's allergies indicates no known allergies.  Family History  Problem Relation Age of Onset  . Anesthesia problems Neg Hx     Social History Social History  Substance Use Topics  . Smoking status: Former Games developermoker  . Smokeless tobacco: None  . Alcohol Use: No    Review of Systems Constitutional: No fever/chills Eyes: No visual changes. Positive photophobia ENT: No sore throat. Positive dental pain. Cardiovascular: Denies chest pain. Respiratory: Denies shortness of breath. Gastrointestinal:.  Positive nausea, positive vomiting.  No diarrhea.  No constipation. Genitourinary: Negative for dysuria. Musculoskeletal: Negative for back pain. Skin: Negative for rash. Neurological: Positive for right sided headaches, no focal weakness or numbness.  10-point ROS otherwise negative.  ____________________________________________   PHYSICAL EXAM:  VITAL SIGNS: ED Triage Vitals  Enc Vitals Group     BP 04/29/15 1504 120/74 mmHg     Pulse Rate 04/29/15 1447 99     Resp 04/29/15 1447 24     Temp --      Temp src --      SpO2 04/29/15 1447 98 %     Weight 04/29/15 1447 185 lb (  83.915 kg)     Height 04/29/15 1447  (1.676 m)     Head Cir --      Peak Flow --      Pain Score 04/29/15 1457 10     Pain Loc --      Pain Edu? --      Excl. in GC? --     Constitutional: Alert and oriented. Well appearing and in no acute distress. Eyes: Conjunctivae are normal. PERRL. EOMI. Head: Atraumatic. Nose: No congestion/rhinnorhea. Mouth/Throat: Mucous membranes are moist.  Oropharynx non-erythematous. There is some tenderness on palpation of the gums on the right jaw line however no actual abscess was seen. Neck: No stridor.  No cervical spine tenderness on palpation Hematological/Lymphatic/Immunilogical: No cervical lymphadenopathy. Cardiovascular: Normal rate, regular rhythm. Grossly  normal heart sounds.  Good peripheral circulation. Respiratory: Normal respiratory effort.  No retractions. Lungs CTAB. Gastrointestinal: Soft and nontender. No distention. No abdominal bruits. No CVA tenderness. Musculoskeletal: No lower extremity tenderness nor edema.  No joint effusions. Neurologic:  Normal speech and language. No gross focal neurologic deficits are appreciated. No gait instability. Skin:  Skin is warm, dry and intact. No rash noted. Psychiatric: Mood and affect are normal. Speech and behavior are normal.  ____________________________________________   LABS (all labs ordered are listed, but only abnormal results are displayed)  Labs Reviewed  BASIC METABOLIC PANEL - Abnormal; Notable for the following:    Anion gap 4 (*)    All other components within normal limits  CBC WITH DIFFERENTIAL/PLATELET  PROTIME-INR  POCT PREGNANCY, URINE  POC URINE PREG, ED   RADIOLOGY  CT of the head showed no acute intracranial hemorrhage, mass lesion or evidence that would explain patient's headache. ____________________________________________   PROCEDURES  Procedure(s) performed: None  Critical Care performed: No  ____________________________________________   INITIAL IMPRESSION / ASSESSMENT AND PLAN / ED COURSE  Pertinent labs & imaging results that were available during my care of the patient were reviewed by me and considered in my medical decision making (see chart for details).  Patient was given 30 g IV Toradol with moderate relief of her headache. Patient is no longer photophobic. Zofran has helped with the nausea. Patient is now able to tolerate sitting up, talking, and is being ambulatory in the emergency room without assistance. Patient was given a prescription for amoxicillin 500 mg 3 times a day for 10 days along with tramadol for pain. She is to call for an appointment with her dentist this as possible. ____________________________________________   FINAL  CLINICAL IMPRESSION(S) / ED DIAGNOSES  Final diagnoses:  Acute nonintractable headache, unspecified headache type  Pain, dental      Tommi Rumps, PA-C 04/29/15 1915  Emily Filbert, MD 04/29/15 [phone]

## 2015-04-29 NOTE — ED Notes (Addendum)
Pt states she is unable open her mouth for temperature

## 2015-04-29 NOTE — ED Notes (Signed)
Developed right jaw pain   Min swelling.. And also states she is having headache

## 2015-04-29 NOTE — ED Notes (Signed)
Urine pregnancy negative  

## 2016-05-14 ENCOUNTER — Telehealth: Payer: Self-pay | Admitting: *Deleted

## 2016-05-14 NOTE — Telephone Encounter (Signed)
Patient called to check and see if we have any copies of her immunization record from when she was a patient here.  It has been more than 10 years and I informed her that it would likely be in a storage unit with our other paper charts.  Patient is aware of this and plans to stop by to sign a release of information to at least check on this.  She has also contacted her high school to try and get what information they have on hand. Jazmin Hartsell,CMA

## 2017-07-26 ENCOUNTER — Ambulatory Visit
Admission: EM | Admit: 2017-07-26 | Discharge: 2017-07-26 | Disposition: A | Discharge status: Home / Self Care | Payer: BLUE CROSS/BLUE SHIELD | Attending: Emergency Medicine | Admitting: Emergency Medicine

## 2017-07-26 ENCOUNTER — Other Ambulatory Visit: Payer: Self-pay

## 2017-07-26 ENCOUNTER — Ambulatory Visit (INDEPENDENT_AMBULATORY_CARE_PROVIDER_SITE_OTHER): Payer: BLUE CROSS/BLUE SHIELD

## 2017-07-26 DIAGNOSIS — R062 Wheezing: Secondary | ICD-10-CM

## 2017-07-26 DIAGNOSIS — R0789 Other chest pain: Secondary | ICD-10-CM

## 2017-07-26 DIAGNOSIS — R05 Cough: Secondary | ICD-10-CM

## 2017-07-26 DIAGNOSIS — J40 Bronchitis, not specified as acute or chronic: Secondary | ICD-10-CM | POA: Diagnosis not present

## 2017-07-26 DIAGNOSIS — J399 Disease of upper respiratory tract, unspecified: Secondary | ICD-10-CM | POA: Diagnosis not present

## 2017-07-26 DIAGNOSIS — R059 Cough, unspecified: Secondary | ICD-10-CM

## 2017-07-26 DIAGNOSIS — J45909 Unspecified asthma, uncomplicated: Secondary | ICD-10-CM

## 2017-07-26 MED ORDER — FLUTICASONE PROPIONATE 50 MCG/ACT NA SUSP: 1.0000 | Freq: Every day | NASAL | 2 refills | Status: DC

## 2017-07-26 MED ORDER — ALBUTEROL SULFATE HFA 108 (90 BASE) MCG/ACT IN AERS: 2.0000 | INHALATION_SPRAY | Freq: Four times a day (QID) | RESPIRATORY_TRACT | 0 refills | Status: DC | PRN

## 2017-07-26 MED ORDER — BENZONATATE 100 MG PO CAPS: 100.0000 mg | ORAL_CAPSULE | Freq: Three times a day (TID) | ORAL | 0 refills | Status: DC

## 2017-07-26 MED ORDER — MONTELUKAST SODIUM 10 MG PO TABS
10.0000 mg | ORAL_TABLET | Freq: Every day | ORAL | 0 refills | Status: AC
Start: 2017-07-26 — End: ?

## 2017-07-26 MED ORDER — IPRATROPIUM-ALBUTEROL 0.5-2.5 (3) MG/3ML IN SOLN
3.0000 mL | Freq: Once | RESPIRATORY_TRACT | Status: AC
  Administered 2017-07-26: 3 mL via RESPIRATORY_TRACT

## 2017-07-26 NOTE — ED Triage Notes (Signed)
Pt with hx of asthma. Pt has had cough x one month. Stopped smoking last week but not helping. Coughing so much she is having urinary incontinence. Has facial/forehead pain. Using her inhaler and not helping.

## 2017-07-26 NOTE — Discharge Instructions (Signed)
-  Tessalon: 1 tablet every 8 hours as needed for cough -Albuterol: 2 puffs every 6 hours as needed for cough, shortness of breath, wheezing -Flonase: 1 spray to both nostrils daily, spray directed slightly towards the ears -Singulair: 1 tablet daily -Rest and fluids. -Return to clinic or follow with primary provider as needed symptoms worsen or not improve

## 2017-07-26 NOTE — ED Provider Notes (Signed)
[facility]    CSN: [id number] Arrival date & time: 07/26/17  1038     History   Chief Complaint Chief Complaint  Patient presents with  . Cough  . Wheezing    HPI LATIFAH PADIN is a 32 y.o. female.   Patient is a 32 year old female who presents with complaint of being sick off and on for about a month with a bad cough.  States that it has seemed to gotten worse over the last week, stating the cough is been unbearable.  She also reports a headache across her forehead makes it harder to concentrate at work.  She reports some occasional production of a clear mucus in the morning that she seems to think looks more like kind of drainage congestion type stuff.  She reports she has been using albuterol inhaler but is not taking anything else for her cough.  She does report some nausea and some abdominal discomfort.  The abdominal pain she attributed more to her cough.  Patient does report some shortness of breath.  Patient also reports that she did quit smoking last week but that does not seem to have helped in the yet.      Past Medical History:  Diagnosis Date  . Anxiety    on celexa  . SVD (spontaneous vaginal delivery) 11/07/2011    Patient Active Problem List   Diagnosis Date Noted  . SVD (spontaneous vaginal delivery) 11/07/2011    Past Surgical History:  Procedure Laterality Date  . DILATION AND CURETTAGE OF UTERUS  2009    OB History    Gravida Para Term Preterm AB Living   5 1 1  0 1 1   SAB TAB Ectopic Multiple Live Births   1 0 0 0 1       Home Medications    Prior to Admission medications   Medication Sig Start Date End Date Taking? Authorizing Provider  albuterol (PROVENTIL HFA;VENTOLIN HFA) 108 (90 Base) MCG/ACT inhaler Inhale 2 puffs into the lungs every 6 (six) hours as needed for wheezing or shortness of breath (cough). 07/26/17   Candis Schatz, PA-C  amoxicillin (AMOXIL) 500 MG capsule Take 1 capsule (500 mg total) by mouth 3  (three) times daily. 04/29/15   Tommi Rumps, PA-C  benzonatate (TESSALON) 100 MG capsule Take 1 capsule (100 mg total) by mouth every 8 (eight) hours. 07/26/17   Candis Schatz, PA-C  cimetidine (CIMETIDINE 200) 200 MG tablet Take 200 mg by mouth 4 (four) times daily.    [provider]  clindamycin (CLEOCIN) 300 MG capsule Take 1 capsule (300 mg total) by mouth 4 (four) times daily. 07/19/13   Rodolph Bong, MD  fluticasone (FLONASE) 50 MCG/ACT nasal spray Place 1 spray into both nostrils daily. 07/26/17   Candis Schatz, PA-C  montelukast (SINGULAIR) 10 MG tablet Take 1 tablet (10 mg total) by mouth at bedtime. 07/26/17   Candis Schatz, PA-C  prenatal vitamin w/FE, FA (PRENATAL 1 + 1) 27-1 MG TABS Take 1 tablet by mouth daily. 11/09/11   [name]-Stuckert, Augusto Gamble, MD  traMADol (ULTRAM) 50 MG tablet Take 1 tablet (50 mg total) by mouth every 6 (six) hours as needed. 04/29/15   Tommi Rumps, PA-C    Family History Family History  Problem Relation Age of Onset  . Cancer Mother   . Anesthesia problems Neg Hx     Social History Social History   Tobacco Use  . Smoking status: Current  Every Day Smoker    Types: Cigarettes  . Smokeless tobacco: Never Used  Substance Use Topics  . Alcohol use: No  . Drug use: No     Allergies   Patient has no known allergies.   Review of Systems Review of Systems  As noted above in HPI.  Other systems reviewed and found to be negative   Physical Exam Triage Vital Signs ED Triage Vitals  Enc Vitals Group     BP 07/26/17 1107 98/72     Pulse Rate 07/26/17 1107 71     Resp 07/26/17 1107 20     Temp 07/26/17 1107 98.1 F (36.7 C)     Temp Source 07/26/17 1107 Oral     SpO2 07/26/17 1107 100 %     Weight 07/26/17 1111 200 lb (90.7 kg)     Height 07/26/17 1111 5\' 5"  (1.651 m)     Head Circumference --      Peak Flow --      Pain Score 07/26/17 1110 9     Pain Loc --      Pain Edu? --      Excl. in GC? --    No data  found.  Updated Vital Signs BP 98/72 (BP Location: Right Arm)   Pulse 71   Temp 98.1 F (36.7 C) (Oral)   Resp 20   Ht 5\' 5"  (1.651 m)   Wt 200 lb (90.7 kg)   LMP 07/05/2017   SpO2 99%   BMI 33.28 kg/m   Physical Exam  Constitutional: She is oriented to person, place, and time. She appears well-developed and well-nourished. She appears distressed.  Tachypnea with shortness of breath  HENT:  Right Ear: Ear canal normal. Tympanic membrane is not erythematous. A middle ear effusion is present.  Left Ear: Ear canal normal. Tympanic membrane is not erythematous. A middle ear effusion is present.  Nose: Right sinus exhibits no maxillary sinus tenderness and no frontal sinus tenderness. Left sinus exhibits no maxillary sinus tenderness and no frontal sinus tenderness.  Mouth/Throat: Uvula is midline. Oropharyngeal exudate present.  Cardiovascular: Normal rate, regular rhythm and normal heart sounds. Exam reveals no friction rub.  No murmur heard. Pulmonary/Chest: She has wheezes.  Tachypnea with increased work of breathing.  Expiratory wheezing bilateral upper lungs, right worse than left.  Cough with deep inspiration  Abdominal: Soft. Bowel sounds are normal.  Musculoskeletal: Normal range of motion. She exhibits no edema.  Neurological: She is alert and oriented to person, place, and time. No cranial nerve deficit.     UC Treatments / Results  Labs (all labs ordered are listed, but only abnormal results are displayed) Labs Reviewed - No data to display  EKG  EKG Interpretation None       Radiology Dg Chest 2 View  Result Date: 07/26/2017 CLINICAL DATA:  Cough for 1 month, chest pain with coughing, shortness of breath, asthma, smoking EXAM: CHEST  2 VIEW COMPARISON:  01/04/2014 FINDINGS: Normal heart size, mediastinal contours, and pulmonary vascularity. Mild chronic bronchitic changes. Lungs otherwise clear. No pleural effusion or pneumothorax. No acute bony abnormalities.  IMPRESSION: Mild chronic peribronchial thickening which could reflect bronchitis, asthma or smoking. No acute infiltrate. Electronically Signed   By: Ulyses Southward M.D.   On: 07/26/2017 11:55    Procedures Procedures (including critical care time)  Medications Ordered in UC Medications  ipratropium-albuterol (DUONEB) 0.5-2.5 (3) MG/3ML nebulizer solution 3 mL (3 mLs Nebulization Given 07/26/17 1212)  Initial Impression / Assessment and Plan / UC Course  I have reviewed the triage vital signs and the nursing notes.  Pertinent labs & imaging results that were available during my care of the patient were reviewed by me and considered in my medical decision making (see chart for details).    Patient with complaint of cough for about 1 month, seems to be worse last week.  X-ray as above with question of asthma versus bronchitis.  Patient reports stopping smoking last week.  Patient does have some wheezing and tachypnea so given a DuoNeb breathing treatment.  We will send him with a prescription for albuterol, Flonase, Singulair, and Tessalon for cough.  Final Clinical Impressions(s) / UC Diagnoses   Final diagnoses:  Bronchitis  Cough  Upper respiratory disease    ED Discharge Orders        Ordered    fluticasone (FLONASE) 50 MCG/ACT nasal spray  Daily     07/26/17 1218    montelukast (SINGULAIR) 10 MG tablet  Daily at bedtime     07/26/17 1218    albuterol (PROVENTIL HFA;VENTOLIN HFA) 108 (90 Base) MCG/ACT inhaler  Every 6 hours PRN     07/26/17 1218    benzonatate (TESSALON) 100 MG capsule  Every 8 hours     07/26/17 1218       Controlled Substance Prescriptions [place] Controlled Substance Registry consulted? Not Applicable   Candis SchatzHarris, Heru Montz D, PA-C 07/26/17 1221

## 2017-07-29 ENCOUNTER — Telehealth: Payer: Self-pay

## 2017-07-29 NOTE — Telephone Encounter (Signed)
Called to follow up with patient since visit here at [facility]. Patient instructed to call back with any questions or concerns. MAH  

## 2017-08-03 DIAGNOSIS — J45901 Unspecified asthma with (acute) exacerbation: Secondary | ICD-10-CM | POA: Insufficient documentation

## 2017-08-31 ENCOUNTER — Other Ambulatory Visit: Payer: Self-pay

## 2017-08-31 ENCOUNTER — Emergency Department
Admission: EM | Admit: 2017-08-31 | Discharge: 2017-08-31 | Disposition: A | Discharge status: Home / Self Care | Payer: BLUE CROSS/BLUE SHIELD | Attending: Emergency Medicine | Admitting: Emergency Medicine

## 2017-08-31 ENCOUNTER — Encounter: Payer: Self-pay | Admitting: Emergency Medicine

## 2017-08-31 ENCOUNTER — Emergency Department: Payer: BLUE CROSS/BLUE SHIELD

## 2017-08-31 DIAGNOSIS — F17210 Nicotine dependence, cigarettes, uncomplicated: Secondary | ICD-10-CM | POA: Insufficient documentation

## 2017-08-31 DIAGNOSIS — J209 Acute bronchitis, unspecified: Secondary | ICD-10-CM | POA: Diagnosis not present

## 2017-08-31 DIAGNOSIS — F419 Anxiety disorder, unspecified: Secondary | ICD-10-CM | POA: Diagnosis not present

## 2017-08-31 DIAGNOSIS — Z79899 Other long term (current) drug therapy: Secondary | ICD-10-CM | POA: Diagnosis not present

## 2017-08-31 DIAGNOSIS — R05 Cough: Secondary | ICD-10-CM | POA: Diagnosis present

## 2017-08-31 MED ORDER — PREDNISONE 10 MG (48) PO TBPK: ORAL_TABLET | ORAL | 0 refills | Status: DC

## 2017-08-31 MED ORDER — AZITHROMYCIN 250 MG PO TABS: ORAL_TABLET | ORAL | 0 refills | Status: DC

## 2017-08-31 MED ORDER — FLUCONAZOLE 150 MG PO TABS: 150.0000 mg | ORAL_TABLET | Freq: Every day | ORAL | 0 refills | Status: DC

## 2017-08-31 NOTE — Discharge Instructions (Signed)
Follow-up with your primary care doctor or urgent care if any continued problems.  Chest x-ray is negative for pneumonia.  Begin taking prednisone as directed, Zithromax as directed, and Diflucan if needed for yeast infection.  Continue  your inhalers as prescribed by your doctor. Increase fluids.  Tylenol if needed for fever or body aches.

## 2017-08-31 NOTE — ED Triage Notes (Signed)
Presents with cough for about 1 month  Has been seen and dx'd with bronchitis and placed on prednisone and inhalers  finished last  Dose of pred about 2 weeks ago    Denies any fever    States has is occasionally productive

## 2017-08-31 NOTE — ED Provider Notes (Signed)
[facility] Emergency Department Provider Note   ____________________________________________   First MD Initiated Contact with Patient 08/31/17 [phone]     (approximate)  I have reviewed the triage vital signs and the nursing notes.   HISTORY  Chief Complaint Cough  HPI Becky Dunlap is a 32 y.o. female is here with complaint of cough for 1 month.  Patient states that she was diagnosed with bronchitis and her PCP placed her on prednisone and inhalers per patient states she was also placed on Levaquin and is finished that and again has been seen by her PCP.  Patient is unaware of any fever.  She states that occasionally her cough is productive.  Patient is a cigarette smoker but has decreased her smoking secondary to her illness.  Patient states she has been using her albuterol inhaler due to wheezing and cough.   Past Medical History:  Diagnosis Date  . Anxiety    on celexa  . SVD (spontaneous vaginal delivery) 11/07/2011    Patient Active Problem List   Diagnosis Date Noted  . SVD (spontaneous vaginal delivery) 11/07/2011    Past Surgical History:  Procedure Laterality Date  . DILATION AND CURETTAGE OF UTERUS  2009    Prior to Admission medications   Medication Sig Start Date End Date Taking? Authorizing Provider  albuterol (PROVENTIL HFA;VENTOLIN HFA) 108 (90 Base) MCG/ACT inhaler Inhale 2 puffs into the lungs every 6 (six) hours as needed for wheezing or shortness of breath (cough). 07/26/17   Candis Schatz, PA-C  azithromycin (ZITHROMAX Z-PAK) 250 MG tablet Take 2 tablets (500 mg) on  Day 1,  followed by 1 tablet (250 mg) once daily on Days 2 through 5. 08/31/17   Tommi Rumps, PA-C  cimetidine (CIMETIDINE 200) 200 MG tablet Take 200 mg by mouth 4 (four) times daily.    [provider]  fluconazole (DIFLUCAN) 150 MG tablet Take 1 tablet (150 mg total) by mouth daily. 08/31/17   Tommi Rumps, PA-C  montelukast (SINGULAIR)  10 MG tablet Take 1 tablet (10 mg total) by mouth at bedtime. 07/26/17   Candis Schatz, PA-C  predniSONE (STERAPRED UNI-PAK 48 TAB) 10 MG (48) TBPK tablet Take 6 tablets for 2 days, 5 tablets for 2 days, 4 tablets for 2 days, 3 tablets for 2 days, 2 tablets for 2 days, 1 tablet for 2 days. 08/31/17   Tommi Rumps, PA-C    Allergies Patient has no known allergies.  Family History  Problem Relation Age of Onset  . Cancer Mother   . Anesthesia problems Neg Hx     Social History Social History   Tobacco Use  . Smoking status: Current Every Day Smoker    Types: Cigarettes  . Smokeless tobacco: Never Used  Substance Use Topics  . Alcohol use: No  . Drug use: No    Review of Systems Constitutional: No fever/chills Eyes: No visual changes. ENT: No sore throat.  Negative for ear pain. Cardiovascular: Denies chest pain. Respiratory: Denies shortness of breath.  Positive for productive cough. Gastrointestinal: No abdominal pain.  No nausea, no vomiting.  Genitourinary: Negative for dysuria. Musculoskeletal: Negative for back pain. Skin: Negative for rash. Neurological: Negative for headaches, focal weakness or numbness. ___________________________________________   PHYSICAL EXAM:  VITAL SIGNS: ED Triage Vitals [08/31/17 0838]  Enc Vitals Group     BP 113/64     Pulse Rate 86     Resp 20  Temp 98.2 F (36.8 C)     Temp Source Oral     SpO2 100 %     Weight 195 lb (88.5 kg)     Height 5\' 6"  (1.676 m)     Head Circumference      Peak Flow      Pain Score      Pain Loc      Pain Edu?      Excl. in GC?    Constitutional: Alert and oriented. Well appearing and in no acute distress. Eyes: Conjunctivae are normal.  Head: Atraumatic. Nose: Mild congestion/rhinnorhea.  TMs are dull bilaterally Mouth/Throat: Mucous membranes are moist.  Oropharynx non-erythematous. Neck: No stridor.   Hematological/Lymphatic/Immunilogical: No cervical  lymphadenopathy. Cardiovascular: Normal rate, regular rhythm. Grossly normal heart sounds.  Good peripheral circulation. Respiratory: Normal respiratory effort.  No retractions. Lungs CTAB. Musculoskeletal: Moves upper and lower extremities without any difficulty.  Normal gait was noted. Neurologic:  Normal speech and language. No gross focal neurologic deficits are appreciated.  Skin:  Skin is warm, dry and intact. No rash noted. Psychiatric: Mood and affect are normal. Speech and behavior are normal.  ____________________________________________   LABS (all labs ordered are listed, but only abnormal results are displayed)  Labs Reviewed - No data to display  RADIOLOGY  ED MD interpretation:   Chest x-ray was negative for pneumonia.  Official radiology report(s): Dg Chest 2 View  Result Date: 08/31/2017 CLINICAL DATA:  Cough for the past month. EXAM: CHEST  2 VIEW COMPARISON:  Chest x-ray dated July 26, 2017. FINDINGS: The heart size and mediastinal contours are within normal limits. Both lungs are clear. The visualized skeletal structures are unremarkable. IMPRESSION: No active cardiopulmonary disease. Electronically Signed   By: Obie DredgeWilliam T Derry M.D.   On: 08/31/2017 09:44    ____________________________________________   PROCEDURES  Procedure(s) performed: None  Procedures  Critical Care performed: No  ____________________________________________   INITIAL IMPRESSION / ASSESSMENT AND PLAN / ED COURSE Was reassured that chest x-ray is negative for pneumonia.  We discussed her dosage of prednisone and that she took small dose for several days.  Patient was placed on Zithromax as directed, prednisone and Diflucan if needed for yeast.  She is encouraged to follow-up with her PCP if any continued problems.  Patient will return to the emergency room if any severe worsening of her symptoms.  She is encouraged to drink fluids and take Tylenol if  needed.  ____________________________________________   FINAL CLINICAL IMPRESSION(S) / ED DIAGNOSES  Final diagnoses:  Acute bronchitis, unspecified organism     ED Discharge Orders        Ordered    azithromycin (ZITHROMAX Z-PAK) 250 MG tablet     08/31/17 1014    predniSONE (STERAPRED UNI-PAK 48 TAB) 10 MG (48) TBPK tablet     08/31/17 1014    fluconazole (DIFLUCAN) 150 MG tablet  Daily     08/31/17 1014       Note:  This document was prepared using Dragon voice recognition software and may include unintentional dictation errors.    Tommi RumpsSummers, Arien Morine L, PA-C 08/31/17 1541    Jeanmarie PlantMcShane, James A, MD 09/01/17 [phone]

## 2018-04-09 IMAGING — CR DG CHEST 2V
1 series · 2 of 2 positions shown · non-contrast
Comparison: Chest x-ray dated July 26, 2017.

CLINICAL DATA: Cough for the past month.

EXAM:
CHEST  2 VIEW

[Series 1: w chest pa · 0.14mm/px · 2 of 2 slices shown]
[im 1/2]
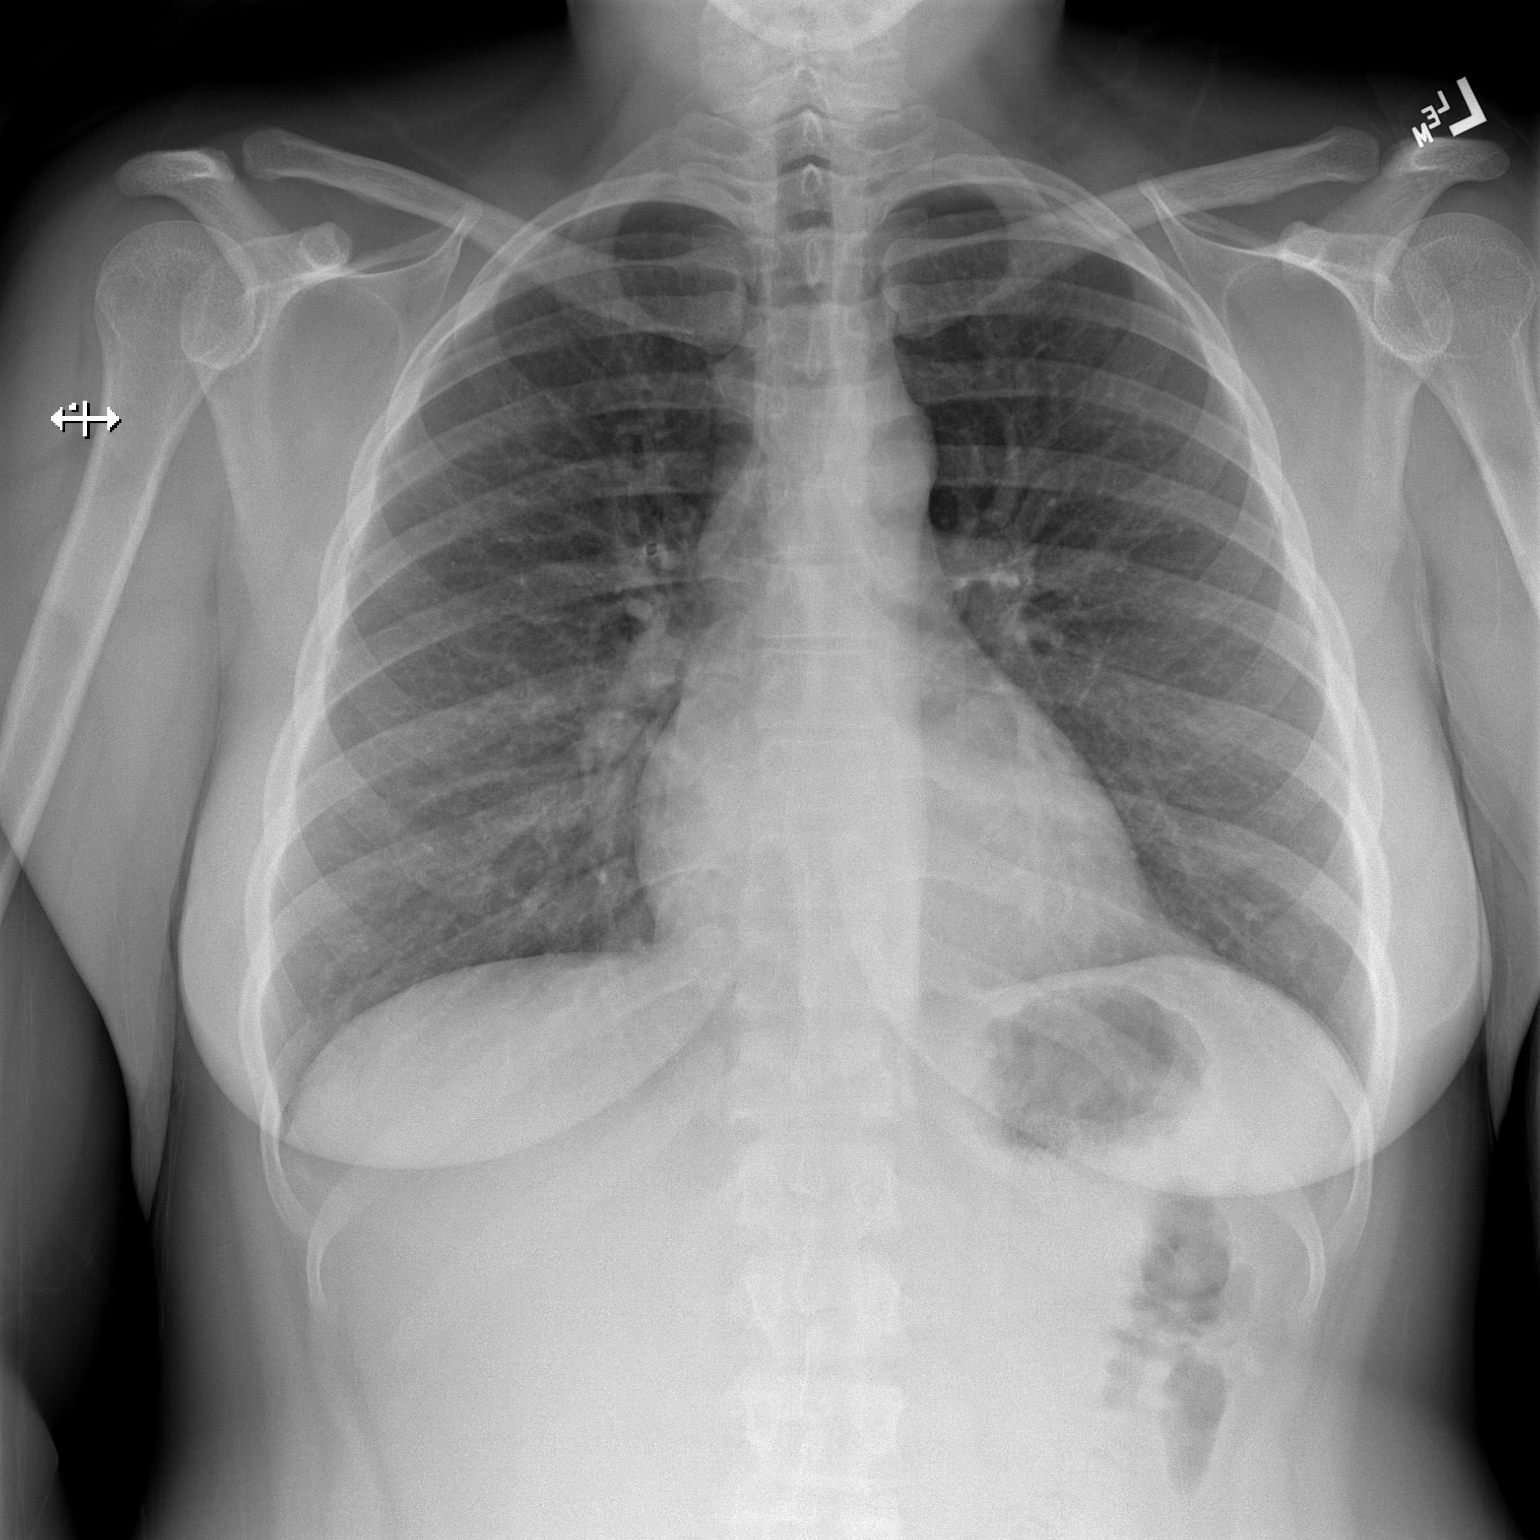
[im 2/2]
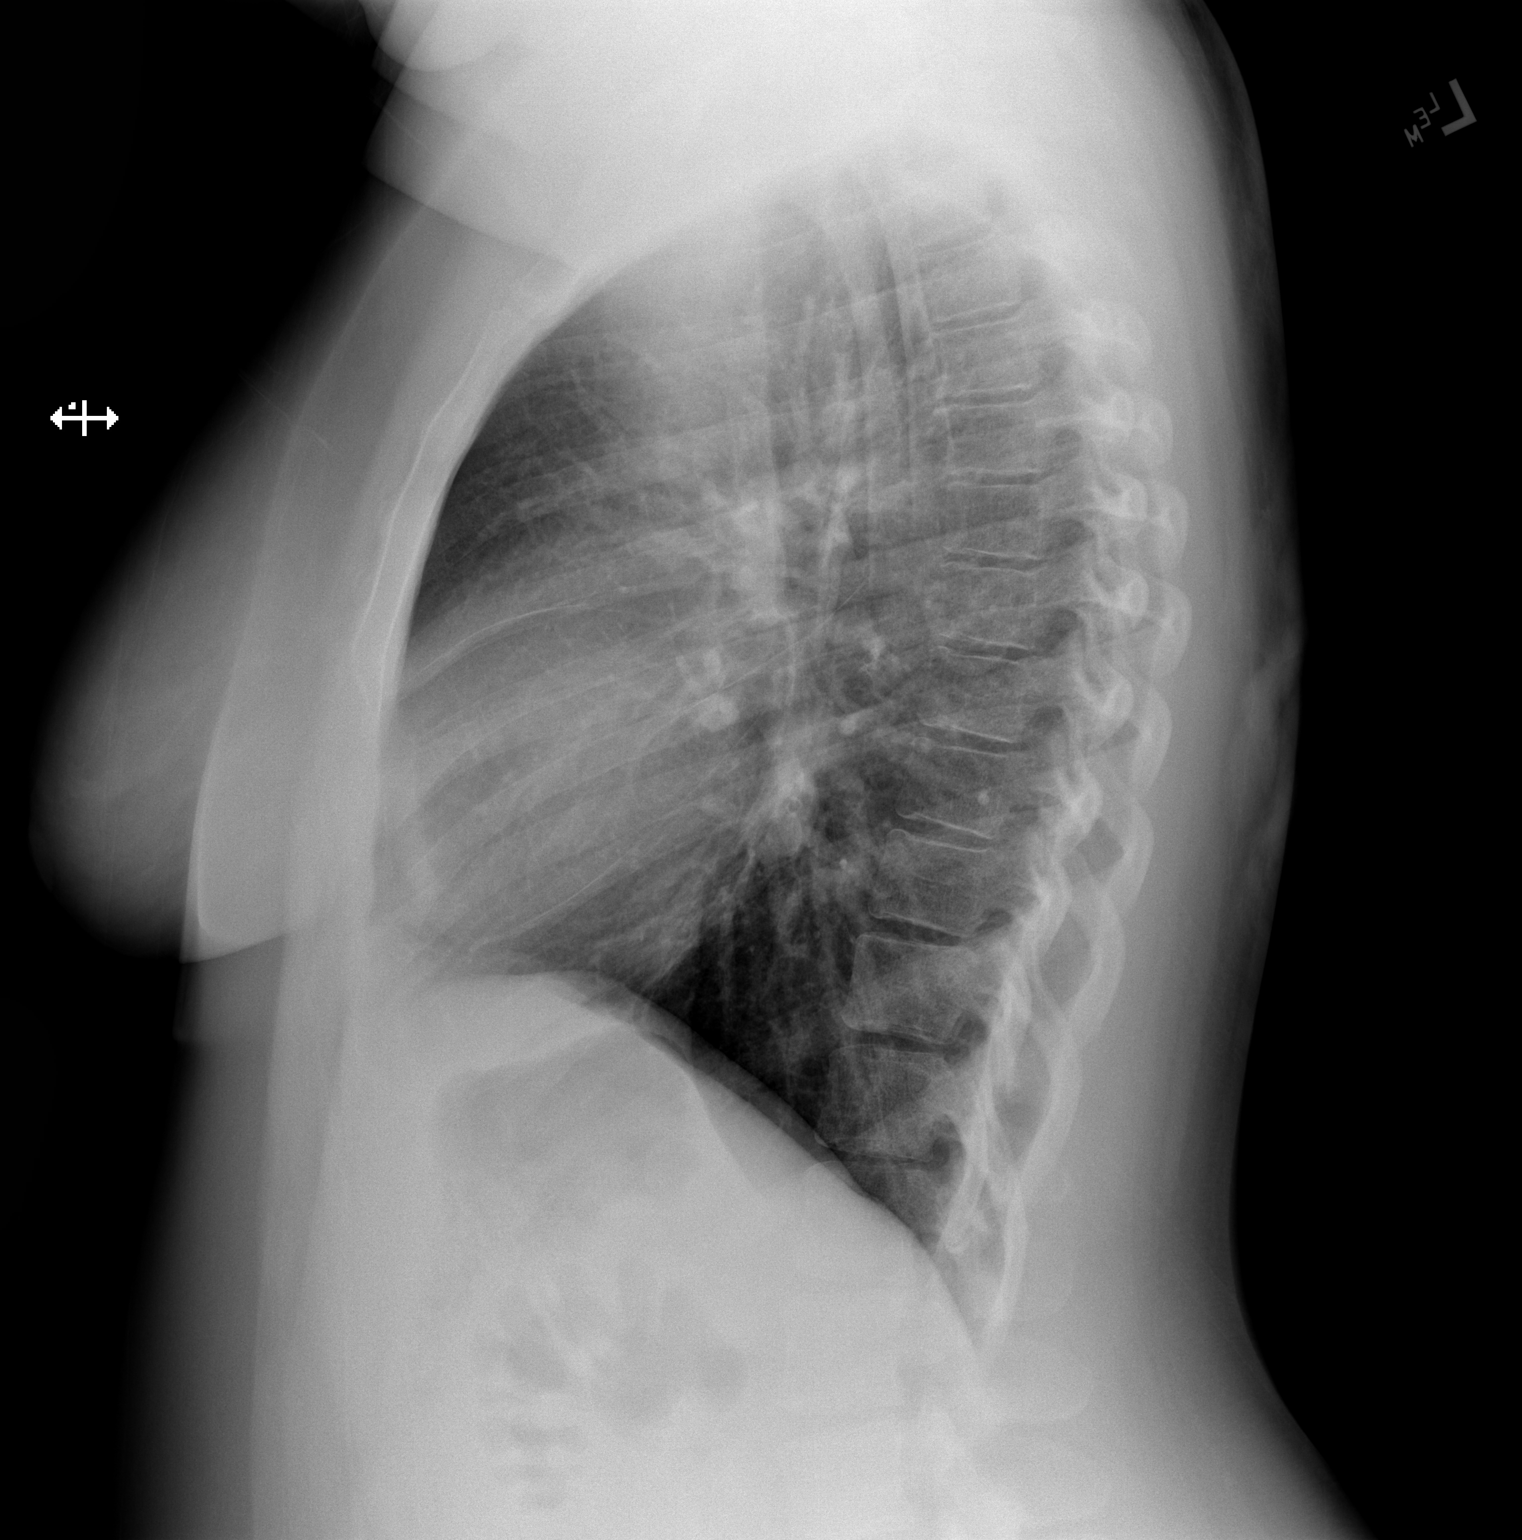

[2 of 2 positions shown; findings below may reference images not displayed]

FINDINGS: The heart size and mediastinal contours are within normal limits.
Both lungs are clear. The visualized skeletal structures are
unremarkable.
IMPRESSION: No active cardiopulmonary disease.

## 2018-08-13 DIAGNOSIS — J40 Bronchitis, not specified as acute or chronic: Secondary | ICD-10-CM | POA: Insufficient documentation

## 2018-08-20 DIAGNOSIS — Z309 Encounter for contraceptive management, unspecified: Secondary | ICD-10-CM | POA: Insufficient documentation

## 2018-09-10 ENCOUNTER — Encounter: Payer: Managed Care, Other (non HMO) | Admitting: Obstetrics and Gynecology

## 2019-04-07 ENCOUNTER — Other Ambulatory Visit: Payer: Self-pay | Admitting: Family Medicine

## 2019-04-07 ENCOUNTER — Other Ambulatory Visit (HOSPITAL_COMMUNITY): Payer: Self-pay | Admitting: Family Medicine

## 2019-04-07 ENCOUNTER — Other Ambulatory Visit: Payer: Self-pay

## 2019-04-07 ENCOUNTER — Encounter (INDEPENDENT_AMBULATORY_CARE_PROVIDER_SITE_OTHER): Payer: Self-pay

## 2019-04-07 ENCOUNTER — Ambulatory Visit
Admission: RE | Admit: 2019-04-07 | Discharge: 2019-04-07 | Disposition: A | Discharge status: Home / Self Care | Payer: Managed Care, Other (non HMO) | Source: Ambulatory Visit | Attending: Family Medicine | Admitting: Family Medicine

## 2019-04-07 DIAGNOSIS — R1032 Left lower quadrant pain: Secondary | ICD-10-CM | POA: Diagnosis present

## 2019-04-26 DIAGNOSIS — R109 Unspecified abdominal pain: Secondary | ICD-10-CM | POA: Insufficient documentation

## 2019-04-27 DIAGNOSIS — R102 Pelvic and perineal pain unspecified side: Secondary | ICD-10-CM | POA: Insufficient documentation

## 2019-04-29 ENCOUNTER — Encounter: Payer: Self-pay | Admitting: Emergency Medicine

## 2019-04-29 ENCOUNTER — Other Ambulatory Visit: Payer: Self-pay

## 2019-04-29 ENCOUNTER — Ambulatory Visit
Admission: EM | Admit: 2019-04-29 | Discharge: 2019-04-29 | Disposition: A | Discharge status: Home / Self Care | Payer: Managed Care, Other (non HMO) | Attending: Family Medicine | Admitting: Family Medicine

## 2019-04-29 DIAGNOSIS — N939 Abnormal uterine and vaginal bleeding, unspecified: Secondary | ICD-10-CM

## 2019-04-29 DIAGNOSIS — R109 Unspecified abdominal pain: Secondary | ICD-10-CM | POA: Diagnosis not present

## 2019-04-29 DIAGNOSIS — Z332 Encounter for elective termination of pregnancy: Secondary | ICD-10-CM

## 2019-04-29 NOTE — ED Triage Notes (Signed)
Patient states that she had a miscarriage and took medicine for this on 10/13.  Patient c/o abdominal cramping and vaginal bleeding for the past 4 days.

## 2019-04-29 NOTE — Discharge Instructions (Signed)
Go to the ER.  ** Patient with recent miscarriage and treatment with methotrexate. Heaving bleeding and now severe abdominal pain. Needs labs and [place]. Thank you  Thersa Salt DO

## 2019-04-29 NOTE — ED Provider Notes (Signed)
[facility]    CSN: [id number] Arrival date & time: 04/29/19  1309  History   Chief Complaint Chief Complaint  Patient presents with  . Abdominal Pain   HPI  33 year old female presents with abdominal pain and vaginal bleeding.  Patient reports that earlier this month she found out she was pregnant.  She subsequently had an ultrasound.  Ultrasound on 10/8 revealed an intrauterine pregnancy at 6 weeks and 1 day.  Fetal bradycardia was noted.  Patient talked to her provider who ordered the ultrasound and she was subsequently placed on methotrexate for nonviable pregnancy/miscarriage.  Patient states that she has had heavy vaginal bleeding since that time.  Appears to be worsening.  Approximately 4 days ago she developed severe abdominal pain.  She states that it is crampy in nature but is quite severe.  She rates her pain is 9/10 in severity.  No fever.  She has been taking ibuprofen without significant improvement.  She has also been given a Toradol shot without significant improvement.  Symptoms appear to be worsening and she is very concerned and this is what prompted her visit today.  PMH, Surgical Hx, Family Hx, Social History reviewed and updated as below.  Past Medical History:  Diagnosis Date  . Anxiety    on celexa  . SVD (spontaneous vaginal delivery) 11/07/2011   Patient Active Problem List   Diagnosis Date Noted  . SVD (spontaneous vaginal delivery) 11/07/2011   Past Surgical History:  Procedure Laterality Date  . DILATION AND CURETTAGE OF UTERUS  2009   OB History    Gravida  6   Para  1   Term  1   Preterm  0   AB  1   Living  1     SAB  1   TAB  0   Ectopic  0   Multiple  0   Live Births  1            Home Medications    Prior to Admission medications   Medication Sig Start Date End Date Taking? Authorizing Provider  [place] 28 0.25-35 MG-MCG tablet  01/07/19  Yes [provider]  albuterol (PROVENTIL HFA;VENTOLIN  HFA) 108 (90 Base) MCG/ACT inhaler Inhale 2 puffs into the lungs every 6 (six) hours as needed for wheezing or shortness of breath (cough). 07/26/17   Luvenia Redden, PA-C  montelukast (SINGULAIR) 10 MG tablet Take 1 tablet (10 mg total) by mouth at bedtime. 07/26/17   Luvenia Redden, PA-C  cimetidine (CIMETIDINE 200) 200 MG tablet Take 200 mg by mouth 4 (four) times daily.  04/29/19  [provider]    Family History Family History  Problem Relation Age of Onset  . Cancer Mother   . Anesthesia problems Neg Hx     Social History Social History   Tobacco Use  . Smoking status: Current Every Day Smoker    Types: Cigarettes  . Smokeless tobacco: Never Used  Substance Use Topics  . Alcohol use: No  . Drug use: No     Allergies   Patient has no known allergies.   Review of Systems Review of Systems  Constitutional: Negative.   Gastrointestinal: Positive for abdominal pain.  Genitourinary: Positive for vaginal bleeding.   Physical Exam Triage Vital Signs ED Triage Vitals  Enc Vitals Group     BP 04/29/19 1339 115/72     Pulse Rate 04/29/19 1339 67     Resp 04/29/19 1339 16  Temp 04/29/19 1339 98.3 F (36.8 C)     Temp Source 04/29/19 1339 Oral     SpO2 04/29/19 1339 100 %     Weight 04/29/19 1336 210 lb (95.3 kg)     Height 04/29/19 1336 5\' 6"  (1.676 m)     Head Circumference --      Peak Flow --      Pain Score 04/29/19 1335 9     Pain Loc --      Pain Edu? --      Excl. in GC? --    Updated Vital Signs BP 115/72 (BP Location: Right Arm)   Pulse 67   Temp 98.3 F (36.8 C) (Oral)   Resp 16   Ht 5\' 6"  (1.676 m)   Wt 95.3 kg   LMP 02/22/2019 (LMP Unknown) Comment: Had a miscarriage  SpO2 100%   Breastfeeding No   BMI 33.89 kg/m   Visual Acuity Right Eye Distance:   Left Eye Distance:   Bilateral Distance:    Right Eye Near:   Left Eye Near:    Bilateral Near:     Physical Exam Vitals signs and nursing note reviewed.   Constitutional:      Appearance: She is obese.     Comments: Appears in distress secondary to pain.  HENT:     Head: Normocephalic and atraumatic.     Nose: Nose normal.  Eyes:     General:        Right eye: No discharge.        Left eye: No discharge.     Conjunctiva/sclera: Conjunctivae normal.  Cardiovascular:     Rate and Rhythm: Normal rate and regular rhythm.  Pulmonary:     Effort: Pulmonary effort is normal. No respiratory distress.  Abdominal:     General: There is no distension.     Palpations: Abdomen is soft.     Tenderness: There is no rebound.     Comments: Diffusely tender to palpation.  Skin:    General: Skin is warm.     Findings: No rash.  Neurological:     Mental Status: She is alert.  Psychiatric:        Mood and Affect: Mood normal.        Behavior: Behavior normal.    UC Treatments / Results  Labs (all labs ordered are listed, but only abnormal results are displayed) Labs Reviewed - No data to display  EKG   Radiology No results found.  Procedures Procedures (including critical care time)  Medications Ordered in UC Medications - No data to display  Initial Impression / Assessment and Plan / UC Course  I have reviewed the triage vital signs and the nursing notes.  Pertinent labs & imaging results that were available during my care of the patient were reviewed by me and considered in my medical decision making (see chart for details).    33 year old female presents with diffuse abdominal pain, heavy vaginal bleeding, and recent therapeutic abortion/treatment with methotrexate.  Concern for complications after treatment including retained products of conception.  Ultrasound not available here until 4 PM.  Patient very uncomfortable.  After lengthy discussion, patient elected to go to the ER.  She is going via 02/24/2019.  Patient needs ultrasound and laboratory evaluation in addition to pain control.  Final Clinical Impressions(s) / UC  Diagnoses   Final diagnoses:  Abdominal pain, unspecified abdominal location  Episode of heavy vaginal bleeding  Therapeutic abortion  Discharge Instructions     Go to the ER.  ** Patient with recent miscarriage and treatment with methotrexate. Heaving bleeding and now severe abdominal pain. Needs labs and US. Thank you  Everlene OtherJayce Hercules Hasler DO    ED Prescriptions    None     PDMP not reviewed this encounter.   Tommie SamsCook, Liliane Mallis G, [place]DO 04/29/19 [phone]

## 2019-07-26 DIAGNOSIS — L219 Seborrheic dermatitis, unspecified: Secondary | ICD-10-CM | POA: Insufficient documentation

## 2019-07-26 DIAGNOSIS — F17200 Nicotine dependence, unspecified, uncomplicated: Secondary | ICD-10-CM | POA: Insufficient documentation

## 2019-11-17 DIAGNOSIS — F331 Major depressive disorder, recurrent, moderate: Secondary | ICD-10-CM | POA: Insufficient documentation

## 2019-11-17 DIAGNOSIS — N921 Excessive and frequent menstruation with irregular cycle: Secondary | ICD-10-CM | POA: Insufficient documentation

## 2020-06-30 ENCOUNTER — Emergency Department: Payer: Medicaid Other

## 2020-06-30 ENCOUNTER — Emergency Department
Admission: EM | Admit: 2020-06-30 | Discharge: 2020-06-30 | Disposition: A | Discharge status: Home / Self Care | Payer: Medicaid Other | Attending: Emergency Medicine | Admitting: Emergency Medicine

## 2020-06-30 ENCOUNTER — Encounter: Payer: Self-pay | Admitting: Emergency Medicine

## 2020-06-30 ENCOUNTER — Other Ambulatory Visit: Payer: Self-pay

## 2020-06-30 DIAGNOSIS — Y92012 Bathroom of single-family (private) house as the place of occurrence of the external cause: Secondary | ICD-10-CM | POA: Insufficient documentation

## 2020-06-30 DIAGNOSIS — K0381 Cracked tooth: Secondary | ICD-10-CM | POA: Insufficient documentation

## 2020-06-30 DIAGNOSIS — K08539 Fractured dental restorative material, unspecified: Secondary | ICD-10-CM

## 2020-06-30 DIAGNOSIS — R0789 Other chest pain: Secondary | ICD-10-CM | POA: Insufficient documentation

## 2020-06-30 DIAGNOSIS — S60222A Contusion of left hand, initial encounter: Secondary | ICD-10-CM

## 2020-06-30 DIAGNOSIS — F17210 Nicotine dependence, cigarettes, uncomplicated: Secondary | ICD-10-CM | POA: Insufficient documentation

## 2020-06-30 DIAGNOSIS — Z79899 Other long term (current) drug therapy: Secondary | ICD-10-CM | POA: Insufficient documentation

## 2020-06-30 DIAGNOSIS — W010XXA Fall on same level from slipping, tripping and stumbling without subsequent striking against object, initial encounter: Secondary | ICD-10-CM | POA: Insufficient documentation

## 2020-06-30 MED ORDER — HYDROCODONE-ACETAMINOPHEN 5-325 MG PO TABS: 1.0000 | ORAL_TABLET | Freq: Three times a day (TID) | ORAL | 0 refills | Status: DC | PRN

## 2020-06-30 MED ORDER — AMOXICILLIN 500 MG PO CAPS: 500.0000 mg | ORAL_CAPSULE | Freq: Three times a day (TID) | ORAL | 0 refills | Status: DC

## 2020-06-30 MED ORDER — AMOXICILLIN 500 MG PO CAPS
500.0000 mg | ORAL_CAPSULE | Freq: Once | ORAL | Status: AC
  Administered 2020-06-30: 500 mg via ORAL
  Filled 2020-06-30: qty 1

## 2020-06-30 MED ORDER — HYDROCODONE-ACETAMINOPHEN 5-325 MG PO TABS
1.0000 | ORAL_TABLET | Freq: Once | ORAL | Status: AC
  Administered 2020-06-30: 1 via ORAL
  Filled 2020-06-30: qty 1

## 2020-06-30 NOTE — ED Provider Notes (Signed)
[facility] Emergency Department Provider Note ____________________________________________  Time seen: 1342  I have reviewed the triage vital signs and the nursing notes.  HISTORY  Chief Complaint  Fall and Dental Injury  HPI Becky Dunlap is a 35 y.o. female presents to the ED for evaluation of injury sustained following a mechanical fall.   Patient describes a fall on a wet bathroom floor yesterday, where she landed on her left side.  She complains of pain to the left hand, pain to the left lateral chest wall, as well as dental injury.  She describes tooth cracked front teeth.  She been taking ibuprofen without benefit.  She presents for evaluation of her symptoms.  She does not have a dental provider with whom she can follow-up.   Past Medical History:  Diagnosis Date  . Anxiety    on celexa  . SVD (spontaneous vaginal delivery) 11/07/2011    Patient Active Problem List   Diagnosis Date Noted  . SVD (spontaneous vaginal delivery) 11/07/2011    Past Surgical History:  Procedure Laterality Date  . DILATION AND CURETTAGE OF UTERUS  2009    Prior to Admission medications   Medication Sig Start Date End Date Taking? Authorizing Provider  albuterol (PROVENTIL HFA;VENTOLIN HFA) 108 (90 Base) MCG/ACT inhaler Inhale 2 puffs into the lungs every 6 (six) hours as needed for wheezing or shortness of breath (cough). 07/26/17   Candis Schatz, PA-C  amoxicillin (AMOXIL) 500 MG capsule Take 1 capsule (500 mg total) by mouth 3 (three) times daily. 06/30/20   Kalea Perine, Charlesetta Ivory, PA-C  HYDROcodone-acetaminophen (NORCO) 5-325 MG tablet Take 1 tablet by mouth 3 (three) times daily as needed. 06/30/20   Khary Schaben, Charlesetta Ivory, PA-C  montelukast (SINGULAIR) 10 MG tablet Take 1 tablet (10 mg total) by mouth at bedtime. 07/26/17   Candis Schatz, PA-C  [place] 28 0.25-35 MG-MCG tablet  01/07/19   [provider]  cimetidine (CIMETIDINE 200) 200 MG  tablet Take 200 mg by mouth 4 (four) times daily.  04/29/19  [provider]    Allergies Patient has no known allergies.  Family History  Problem Relation Age of Onset  . Cancer Mother   . Anesthesia problems Neg Hx     Social History Social History   Tobacco Use  . Smoking status: Current Every Day Smoker    Types: Cigarettes  . Smokeless tobacco: Never Used  Vaping Use  . Vaping Use: Never used  Substance Use Topics  . Alcohol use: No  . Drug use: No    Review of Systems  Constitutional: Negative for fever. Eyes: Negative for visual changes. ENT: Negative for sore throat. Dental injury as above.  Cardiovascular: Negative for chest pain. Respiratory: Negative for shortness of breath. Gastrointestinal: Negative for abdominal pain, vomiting and diarrhea. Genitourinary: Negative for dysuria. Musculoskeletal: Negative for back pain. Left hand pain  Skin: Negative for rash. Neurological: Negative for headaches, focal weakness or numbness. ____________________________________________  PHYSICAL EXAM:  VITAL SIGNS: ED Triage Vitals  Enc Vitals Group     BP 06/30/20 1137 (!) 142/91     Pulse Rate 06/30/20 1137 82     Resp 06/30/20 1137 18     Temp 06/30/20 1137 99.1 F (37.3 C)     Temp Source 06/30/20 1137 Oral     SpO2 06/30/20 1137 100 %     Weight 06/30/20 1140 225 lb (102.1 kg)     Height 06/30/20 1140 5\' 6"  (  1.676 m)     Head Circumference --      Peak Flow --      Pain Score 06/30/20 1140 8     Pain Loc --      Pain Edu? --      Excl. in GC? --     Constitutional: Alert and oriented. Well appearing and in no distress. Head: Normocephalic and atraumatic. Eyes: Conjunctivae are normal. Normal extraocular movements Nose: No congestion/rhinorrhea/epistaxis. Mouth/Throat: Mucous membranes are moist. Primary upper incisors (#8 / #9) with anterior defects. #8 with a distal, medial edge chip, with lingual exposure of dentin. #9 reveal a transverse  fracture extending from the medial to lateral edges, and nearly completely through to the lingual surface. There is laxity of the distal fracture portion. No gumline bleeding or edema is appreciated.  Neck: Supple. No thyromegaly. Cardiovascular: Normal rate, regular rhythm. Normal distal pulses. Respiratory: Normal respiratory effort. No wheezes/rales/rhonchi. Musculoskeletal: left hand without gross deformity or dislocation. The dorsal hand has soft tissue swelling. The palmar aspect has early ecchymosis. normal composite fist noted. Nontender with normal range of motion in all extremities.  Neurologic:  Normal gait without ataxia. Normal speech and language. No gross focal neurologic deficits are appreciated. Skin:  Skin is warm, dry and intact. No rash noted. ____________________________________________  PROCEDURES  Amoxicillin 500 mg PO Norco 5-325 mg PO Ace bandage  .Ortho Injury Treatment  Date/Time: 06/30/2020 2:18 PM Performed by: Minna Antis, MD Authorized by: Lissa Hoard, PA-C   Consent:    Consent obtained:  Verbal   Consent given by:  Patient   Risks discussed:  Nerve damage   Alternatives discussed:  No treatmentInjury location: Primary Upper Incisors. Pre-procedure neurovascular assessment: neurovascularly intact  Anesthesia: Local anesthesia used: no  Patient sedated: NoImmobilization: interdental fixation Splint type: Stabilizing bar. Supplies used: aluminum splint (Nasal bridge from N95 mask) Patient tolerance: patient tolerated the procedure well with no immediate complications   ____________________________________  RADIOLOGY  DG Left Hand  Negative ____________________________________________  INITIAL IMPRESSION / ASSESSMENT AND PLAN / ED COURSE  Patient with ED evaluation of a mechanical fall resulting in dental injury and a left hand contusion. She has her urgent dental fracture stabilized with a splint. Her hand XR is negative  and is placed in an ace bandage. She is referred urgently to [facility] for definitive management on Monday. She is prophylaxed with amoxicillin and pain medicine is provided. She will follow-up as discussed. Return precautions as discussed.   Wallis Mart was evaluated in Emergency Department on 06/30/2020 for the symptoms described in the history of present illness. She was evaluated in the context of the global COVID-19 pandemic, which necessitated consideration that the patient might be at risk for infection with the SARS-CoV-2 virus that causes COVID-19. Institutional protocols and algorithms that pertain to the evaluation of patients at risk for COVID-19 are in a state of rapid change based on information released by regulatory bodies including the CDC and federal and state organizations. These policies and algorithms were followed during the patient's care in the ED.  I reviewed the patient's prescription history over the last 12 months in the multi-state controlled substances database(s) that includes [place], [place], [place], [place], [place], [place], [place], [place], [place], [place], [place], [place], [place], and [place].  Results were notable for no RX history.  ____________________________________________  FINAL CLINICAL IMPRESSION(S) / ED DIAGNOSES  Final diagnoses:  Fracture of crown, enamel, and dentin of  tooth with pulp exposure  Contusion of left hand, initial encounter      Melvenia Needles, PA-C 06/30/20 1510    Harvest Dark, MD 07/01/20 1418

## 2020-06-30 NOTE — Discharge Instructions (Signed)
Call the [facility] at [phone] Take the antibiotic as directed, and the pain medicine as needed. Eat soft foods and use a straw to drink.

## 2020-06-30 NOTE — ED Triage Notes (Signed)
Pt arrived via POV with reports of slipping in bathroom, pt states she fell onto left side. C/o L hand pain as well. Pt states injury happened yesterday.  Swelling noted to L hand.  Pt taking ibuprofen.  Pt also cracked front teeth.  Pt does not have a dentist.

## 2020-09-08 ENCOUNTER — Encounter: Payer: Self-pay | Admitting: Emergency Medicine

## 2020-09-08 ENCOUNTER — Other Ambulatory Visit: Payer: Self-pay

## 2020-09-08 ENCOUNTER — Emergency Department
Admission: EM | Admit: 2020-09-08 | Discharge: 2020-09-08 | Disposition: A | Discharge status: Home / Self Care | Payer: Medicaid Other | Attending: Emergency Medicine | Admitting: Emergency Medicine

## 2020-09-08 DIAGNOSIS — N390 Urinary tract infection, site not specified: Secondary | ICD-10-CM

## 2020-09-08 DIAGNOSIS — Z113 Encounter for screening for infections with a predominantly sexual mode of transmission: Secondary | ICD-10-CM | POA: Insufficient documentation

## 2020-09-08 DIAGNOSIS — F17210 Nicotine dependence, cigarettes, uncomplicated: Secondary | ICD-10-CM | POA: Insufficient documentation

## 2020-09-08 LAB — URINALYSIS, COMPLETE (UACMP) WITH MICROSCOPIC
Bilirubin Urine: NEGATIVE
Glucose, UA: NEGATIVE mg/dL
Ketones, ur: NEGATIVE mg/dL
Nitrite: NEGATIVE
Protein, ur: 30 mg/dL — AB
Specific Gravity, Urine: 1.025 (ref 1.005–1.030)
pH: 8 (ref 5.0–8.0)

## 2020-09-08 LAB — WET PREP, GENITAL
Clue Cells Wet Prep HPF POC: NONE SEEN
Sperm: NONE SEEN
Trich, Wet Prep: NONE SEEN
Yeast Wet Prep HPF POC: NONE SEEN

## 2020-09-08 LAB — CHLAMYDIA/NGC RT PCR (ARMC ONLY)
Chlamydia Tr: NOT DETECTED
N gonorrhoeae: NOT DETECTED

## 2020-09-08 LAB — POC URINE PREG, ED: Preg Test, Ur: NEGATIVE

## 2020-09-08 MED ORDER — SULFAMETHOXAZOLE-TRIMETHOPRIM 800-160 MG PO TABS: 1.0000 | ORAL_TABLET | Freq: Two times a day (BID) | ORAL | 0 refills | Status: DC

## 2020-09-08 NOTE — ED Notes (Signed)
See triage note. Pt reports history of having trick and BV. Reports currently experiencing "odor and itching" x1 week. States her and partner "use condoms" but that "he takes them off". Denies any other symptoms. In NAD.

## 2020-09-08 NOTE — ED Notes (Signed)
This RN at bedside to assist while provide completing pelvic exam.

## 2020-09-08 NOTE — ED Triage Notes (Signed)
Pt reports she found doxycycline in her boyfriends house and she does not know why he is taking it.   Pt states she wants to be checked for Chlamydia, Gonorrhea, syphilis, BV and trick and if she does not have it she wants to be proactively treated for it.

## 2020-09-08 NOTE — ED Provider Notes (Signed)
[facility] Emergency Department Provider Note  ____________________________________________   Event Date/Time   First MD Initiated Contact with Patient 09/08/20 1114     (approximate)  I have reviewed the triage vital signs and the nursing notes.   HISTORY  Chief Complaint SEXUALLY TRANSMITTED DISEASE   HPI Becky Dunlap is a 35 y.o. female presents to the ED with concerns of having an STD.  Patient states that she and her partner use condoms but she reports that "he takes them off".  Patient currently is experiencing a whitish vaginal discharge with itching and an odor for 1 week.  She also is highly suspicious because she found a bottle of doxycycline in her boyfriend's house and would not get a straight answer from him.  Patient also reports that she found a condom inside her that has been there since last time she had intercourse.       Past Medical History:  Diagnosis Date  . Anxiety    on celexa  . SVD (spontaneous vaginal delivery) 11/07/2011    Patient Active Problem List   Diagnosis Date Noted  . SVD (spontaneous vaginal delivery) 11/07/2011    Past Surgical History:  Procedure Laterality Date  . DILATION AND CURETTAGE OF UTERUS  2009    Prior to Admission medications   Medication Sig Start Date End Date Taking? Authorizing Provider  sulfamethoxazole-trimethoprim (BACTRIM DS) 800-160 MG tablet Take 1 tablet by mouth 2 (two) times daily. 09/08/20  Yes Bridget Hartshorn L, PA-C  albuterol (PROVENTIL HFA;VENTOLIN HFA) 108 (90 Base) MCG/ACT inhaler Inhale 2 puffs into the lungs every 6 (six) hours as needed for wheezing or shortness of breath (cough). 07/26/17   Candis Schatz, PA-C  montelukast (SINGULAIR) 10 MG tablet Take 1 tablet (10 mg total) by mouth at bedtime. 07/26/17   Candis Schatz, PA-C  SPRINTEC 28 0.25-35 MG-MCG tablet  01/07/19   [provider]  cimetidine (CIMETIDINE 200) 200 MG tablet Take 200 mg by mouth  4 (four) times daily.  04/29/19  [provider]    Allergies Patient has no known allergies.  Family History  Problem Relation Age of Onset  . Cancer Mother   . Anesthesia problems Neg Hx     Social History Social History   Tobacco Use  . Smoking status: Current Every Day Smoker    Types: Cigarettes  . Smokeless tobacco: Never Used  Vaping Use  . Vaping Use: Never used  Substance Use Topics  . Alcohol use: No  . Drug use: No    Review of Systems Constitutional: No fever/chills Eyes: No visual changes. Cardiovascular: Denies chest pain. Respiratory: Denies shortness of breath. Gastrointestinal: No abdominal pain.  No nausea, no vomiting.   Genitourinary: Negative for dysuria.  Positive vaginal discharge. Musculoskeletal: Negative for musculoskeletal pain. Skin: Negative for rash. Neurological: Negative for headaches, focal weakness or numbness. ____________________________________________   PHYSICAL EXAM:  VITAL SIGNS: ED Triage Vitals  Enc Vitals Group     BP 09/08/20 1029 134/77     Pulse Rate 09/08/20 1029 92     Resp 09/08/20 1029 20     Temp 09/08/20 1029 98.4 F (36.9 C)     Temp Source 09/08/20 1029 Oral     SpO2 09/08/20 1029 100 %     Weight 09/08/20 1027 219 lb (99.3 kg)     Height 09/08/20 1027 5\' 5"  (1.651 m)     Head Circumference --      Peak  Flow --      Pain Score 09/08/20 1027 0     Pain Loc --      Pain Edu? --      Excl. in GC? --     Constitutional: Alert and oriented. Well appearing and in no acute distress. Eyes: Conjunctivae are normal.  Head: Atraumatic. Neck: No stridor.   Cardiovascular: Normal rate, regular rhythm. Grossly normal heart sounds.  Good peripheral circulation. Respiratory: Normal respiratory effort.  No retractions. Lungs CTAB. Gastrointestinal: Soft and nontender. No distention.  Genitourinary: External genitalia unremarkable with the exception of a body piercing which is present.  No vaginal  bleeding.  Cervix is uninflamed.  Wet prep and GC and chlamydia test was obtained.  No adnexal masses or tenderness is noted.  No cervical motion tenderness present. Musculoskeletal: Moves upper and lower extremities with any difficulty.  Normal gait was noted. Neurologic:  Normal speech and language. No gross focal neurologic deficits are appreciated.  Skin:  Skin is warm, dry and intact. No rash noted. Psychiatric: Mood and affect are normal. Speech and behavior are normal.  ____________________________________________   LABS (all labs ordered are listed, but only abnormal results are displayed)  Labs Reviewed  WET PREP, GENITAL - Abnormal; Notable for the following components:      Result Value   WBC, Wet Prep HPF POC FEW (*)    All other components within normal limits  URINALYSIS, COMPLETE (UACMP) WITH MICROSCOPIC - Abnormal; Notable for the following components:   Color, Urine YELLOW (*)    APPearance HAZY (*)    Hgb urine dipstick MODERATE (*)    Protein, ur 30 (*)    Leukocytes,Ua TRACE (*)    Bacteria, UA RARE (*)    All other components within normal limits  CHLAMYDIA/NGC RT PCR ([facility] ONLY)  POC URINE PREG, ED     PROCEDURES  Procedure(s) performed (including Critical Care):  Procedures   ____________________________________________   INITIAL IMPRESSION / ASSESSMENT AND PLAN / ED COURSE  As part of my medical decision making, I reviewed the following data within the electronic MEDICAL RECORD NUMBER Notes from prior ED visits and [place] Controlled Substance Database  35 year old female presents to the ED with complaint of pelvic discomfort.  She is also experiencing a vaginal discharge with odor and itching for the last week.  She is very suspicious that her partner may have something as she found a bottle of doxycycline in the house.  Patient also reports that she found a condom in her vagina that had probably been there for close to a week that she removed  yesterday.  Wet prep was negative, GC and Chlamydia were negative.  Urinalysis is consistent with a urinary tract infection.  Patient was made aware and she is also to log into MyChart to see the results of her gonorrhea and Chlamydia test which were pending at the time of her discharge.  Patient was sent a prescription for Bactrim DS twice daily for 10 days and instructions to drink lots of water.  She is to follow-up with her PCP if any continued problems.  If she wishes to have continued STD work-up such as syphilis and HIV she is instructed to call make an appointment at the health department.  ____________________________________________   FINAL CLINICAL IMPRESSION(S) / ED DIAGNOSES  Final diagnoses:  Acute urinary tract infection  Screening for STDs (sexually transmitted diseases)     ED Discharge Orders         Ordered  sulfamethoxazole-trimethoprim (BACTRIM DS) 800-160 MG tablet  2 times daily        09/08/20 1244          *Please note:  TEXANNA HILBURN was evaluated in Emergency Department on 09/08/2020 for the symptoms described in the history of present illness. She was evaluated in the context of the global COVID-19 pandemic, which necessitated consideration that the patient might be at risk for infection with the SARS-CoV-2 virus that causes COVID-19. Institutional protocols and algorithms that pertain to the evaluation of patients at risk for COVID-19 are in a state of rapid change based on information released by regulatory bodies including the CDC and federal and state organizations. These policies and algorithms were followed during the patient's care in the ED.  Some ED evaluations and interventions may be delayed as a result of limited staffing during and the pandemic.*   Note:  This document was prepared using Dragon voice recognition software and may include unintentional dictation errors.    Tommi Rumps, PA-C 09/08/20 1415    Gilles Chiquito,  MD 09/08/20 [phone]

## 2020-09-08 NOTE — ED Notes (Signed)
Pelvic cart to bedside 

## 2020-09-08 NOTE — Discharge Instructions (Addendum)
Begin taking antibiotics until completely finished.  This antibiotic will be twice a day for the next 10 days.  If tablets are large you may recommend to and take each half separately.  Drink lots of fluids.  You may take Tylenol or ibuprofen if needed for pain.  The results of your gonorrhea and Chlamydia test should result later this afternoon.  This can be treated at the health department for free.  The phone number is listed on your discharge papers.  You may also go there to be tested for syphilis and HIV if you desire and if positive for syphilis you can be treated for free as well.

## 2020-09-08 NOTE — ED Triage Notes (Signed)
Pt reports last menstrual cycle was 2 weeks ago but it did not last as long as it usually does so she would like a pregnancy test as well.

## 2020-09-08 NOTE — ED Notes (Signed)
Pt up to restroom to provide urine sample. Two specimen cups given in order to collect both a clean and dirty sample. Pt educated about collection.

## 2021-05-01 ENCOUNTER — Encounter: Payer: Self-pay | Admitting: Licensed Clinical Social Worker

## 2021-05-01 ENCOUNTER — Other Ambulatory Visit: Payer: Self-pay

## 2021-05-01 ENCOUNTER — Ambulatory Visit
Admission: EM | Admit: 2021-05-01 | Discharge: 2021-05-01 | Disposition: A | Discharge status: Home / Self Care | Payer: Self-pay | Attending: Physician Assistant | Admitting: Physician Assistant

## 2021-05-01 DIAGNOSIS — R051 Acute cough: Secondary | ICD-10-CM | POA: Insufficient documentation

## 2021-05-01 DIAGNOSIS — R5383 Other fatigue: Secondary | ICD-10-CM | POA: Insufficient documentation

## 2021-05-01 DIAGNOSIS — J111 Influenza due to unidentified influenza virus with other respiratory manifestations: Secondary | ICD-10-CM | POA: Insufficient documentation

## 2021-05-01 LAB — RAPID INFLUENZA A&B ANTIGENS
Influenza A (ARMC): NEGATIVE
Influenza B (ARMC): NEGATIVE

## 2021-05-01 MED ORDER — OSELTAMIVIR PHOSPHATE 75 MG PO CAPS: 75.0000 mg | ORAL_CAPSULE | Freq: Two times a day (BID) | ORAL | 0 refills | Status: AC

## 2021-05-01 NOTE — Discharge Instructions (Addendum)
-  You have influenza. You tested negative but your daughter was + so I think yours was a false negative.  I have sent Tamiflu which should hopefully make the symptoms more mild and shorten the course by a day or 2.  It still important that you increase rest and fluids and take over-the-counter decongestants such as Sudafed, Mucinex or Robitussin.  Tylenol and/or Motrin as needed for body aches and fever. - You may return to work or school after you have been fever for greater than 24 hours without use of Tylenol or Motrin. - If you develop any severe acute worsening of symptoms such as uncontrollable fever, weakness/lethargy, breathing difficulty you should be seen again immediately.  Please go to emergency department for any severe symptoms.

## 2021-05-01 NOTE — ED Triage Notes (Signed)
Pt c/o headache, cough, sore throat, fatigue, chills x 2 days. Tried robitussin

## 2021-05-01 NOTE — ED Provider Notes (Signed)
[facility]    CSN: [id number] Arrival date & time: 05/01/21  1143      History   Chief Complaint Chief Complaint  Patient presents with   Headache   Fatigue   Cough    HPI Becky Dunlap is a 35 y.o. female presenting for onset of feeling feverish, fatigue, headache, cough, sore throat, and chills yesterday.  Patient's daughter is ill with similar symptoms and daughter has had a fever.  Patient has been taking Mucinex/Robitussin.  She does not report any breathing difficulty, weakness, vomiting or diarrhea.  She is otherwise healthy.  No history of cardiopulmonary disease.  She is a current everyday smoker.  No other complaints.  HPI  Past Medical History:  Diagnosis Date   Anxiety    on celexa   SVD (spontaneous vaginal delivery) 11/07/2011    Patient Active Problem List   Diagnosis Date Noted   SVD (spontaneous vaginal delivery) 11/07/2011    Past Surgical History:  Procedure Laterality Date   DILATION AND CURETTAGE OF UTERUS  2009    OB History     Gravida  6   Para  1   Term  1   Preterm  0   AB  1   Living  1      SAB  1   IAB  0   Ectopic  0   Multiple  0   Live Births  1            Home Medications    Prior to Admission medications   Medication Sig Start Date End Date Taking? Authorizing Provider  oseltamivir (TAMIFLU) 75 MG capsule Take 1 capsule (75 mg total) by mouth every 12 (twelve) hours for 5 days. 05/01/21 05/06/21 Yes Eusebio Friendly B, PA-C  albuterol (PROVENTIL HFA;VENTOLIN HFA) 108 (90 Base) MCG/ACT inhaler Inhale 2 puffs into the lungs every 6 (six) hours as needed for wheezing or shortness of breath (cough). 07/26/17   Candis Schatz, PA-C  montelukast (SINGULAIR) 10 MG tablet Take 1 tablet (10 mg total) by mouth at bedtime. 07/26/17   Candis Schatz, PA-C  SPRINTEC 28 0.25-35 MG-MCG tablet  01/07/19   [provider]  sulfamethoxazole-trimethoprim (BACTRIM DS) 800-160 MG tablet Take 1  tablet by mouth 2 (two) times daily. 09/08/20   Tommi Rumps, PA-C  cimetidine (CIMETIDINE 200) 200 MG tablet Take 200 mg by mouth 4 (four) times daily.  04/29/19  [provider]    Family History Family History  Problem Relation Age of Onset   Cancer Mother    Anesthesia problems Neg Hx     Social History Social History   Tobacco Use   Smoking status: Every Day    Types: Cigarettes   Smokeless tobacco: Never  Vaping Use   Vaping Use: Never used  Substance Use Topics   Alcohol use: No   Drug use: No     Allergies   Patient has no known allergies.   Review of Systems Review of Systems  Constitutional:  Positive for chills and fatigue. Negative for diaphoresis and fever.  HENT:  Positive for congestion, rhinorrhea and sore throat. Negative for ear pain, sinus pressure and sinus pain.   Respiratory:  Positive for cough. Negative for shortness of breath.   Cardiovascular:  Negative for chest pain.  Gastrointestinal:  Negative for abdominal pain, nausea and vomiting.  Musculoskeletal:  Negative for arthralgias and myalgias.  Skin:  Negative for rash.  Neurological:  Positive  for headaches. Negative for weakness.  Hematological:  Negative for adenopathy.    Physical Exam Triage Vital Signs ED Triage Vitals  Enc Vitals Group     BP 05/01/21 1215 125/73     Pulse Rate 05/01/21 1215 73     Resp 05/01/21 1215 16     Temp 05/01/21 1215 98.3 F (36.8 C)     Temp Source 05/01/21 1215 Oral     SpO2 05/01/21 1215 100 %     Weight 05/01/21 1212 203 lb 3.2 oz (92.2 kg)     Height 05/01/21 1212 5\' 6"  (1.676 m)     Head Circumference --      Peak Flow --      Pain Score 05/01/21 1212 8     Pain Loc --      Pain Edu? --      Excl. in GC? --    No data found.  Updated Vital Signs BP 125/73 (BP Location: Right Arm)   Pulse 73   Temp 98.3 F (36.8 C) (Oral)   Resp 16   Ht 5\' 6"  (1.676 m)   Wt 203 lb 3.2 oz (92.2 kg)   LMP 04/22/2021   SpO2 100%   BMI  32.80 kg/m      Physical Exam Vitals and nursing note reviewed.  Constitutional:      General: She is not in acute distress.    Appearance: Normal appearance. She is well-developed. She is ill-appearing. She is not toxic-appearing.  HENT:     Head: Normocephalic and atraumatic.     Nose: Congestion present.     Mouth/Throat:     Mouth: Mucous membranes are moist.     Pharynx: Oropharynx is clear. Posterior oropharyngeal erythema (mild) present.  Eyes:     General: No scleral icterus.       Right eye: No discharge.        Left eye: No discharge.     Conjunctiva/sclera: Conjunctivae normal.  Cardiovascular:     Rate and Rhythm: Normal rate and regular rhythm.     Heart sounds: Normal heart sounds.  Pulmonary:     Effort: Pulmonary effort is normal. No respiratory distress.     Breath sounds: Normal breath sounds.  Musculoskeletal:     Cervical back: Neck supple.  Skin:    General: Skin is dry.  Neurological:     General: No focal deficit present.     Mental Status: She is alert. Mental status is at baseline.     Motor: No weakness.     Gait: Gait normal.  Psychiatric:        Mood and Affect: Mood normal.        Behavior: Behavior normal.        Thought Content: Thought content normal.     UC Treatments / Results  Labs (all labs ordered are listed, but only abnormal results are displayed) Labs Reviewed  RAPID INFLUENZA A&B ANTIGENS    EKG   Radiology No results found.  Procedures Procedures (including critical care time)  Medications Ordered in UC Medications - No data to display  Initial Impression / Assessment and Plan / UC Course  I have reviewed the triage vital signs and the nursing notes.  Pertinent labs & imaging results that were available during my care of the patient were reviewed by me and considered in my medical decision making (see chart for details).  35 year old female presenting for feeling feverish, fatigue, headache, cough, congestion  and  scratchy throat since yesterday.  Daughter ill with similar symptoms.  Vital signs are normal and stable today but patient has ill-appearing.  Nontoxic.  Exam significant for nasal congestion and mild posterior pharyngeal erythema.  Chest clear to auscultation heart regular rate and rhythm.  Rapid flu performed.  Negative.  Her daughter's is positive for influenza A.  Reviewed results with patient.  Advised patient she likely had a false negative test.  We will treat with Tamiflu.  I have also advised supportive care at home with increased rest and fluids and over-the-counter Mucinex or Robitussin.  Patient has been given a work note for the next couple days.  Advised follow-up with [place] as needed.   Final Clinical Impressions(s) / UC Diagnoses   Final diagnoses:  Influenza  Acute cough  Other fatigue     Discharge Instructions      -You have influenza. You tested negative but your daughter was + so I think yours was a false negative.  I have sent Tamiflu which should hopefully make the symptoms more mild and shorten the course by a day or 2.  It still important that you increase rest and fluids and take over-the-counter decongestants such as Sudafed, Mucinex or Robitussin.  Tylenol and/or Motrin as needed for body aches and fever. - You may return to work or school after you have been fever for greater than 24 hours without use of Tylenol or Motrin. - If you develop any severe acute worsening of symptoms such as uncontrollable fever, weakness/lethargy, breathing difficulty you should be seen again immediately.  Please go to emergency department for any severe symptoms.     ED Prescriptions     Medication Sig Dispense Auth. Provider   oseltamivir (TAMIFLU) 75 MG capsule Take 1 capsule (75 mg total) by mouth every 12 (twelve) hours for 5 days. 10 capsule Shirlee Latch, PA-C      PDMP not reviewed this encounter.   Shirlee Latch, PA-C 05/01/21 1356

## 2021-05-06 ENCOUNTER — Encounter: Payer: Self-pay | Admitting: Emergency Medicine

## 2021-05-06 ENCOUNTER — Emergency Department: Payer: Self-pay

## 2021-05-06 ENCOUNTER — Emergency Department
Admission: EM | Admit: 2021-05-06 | Discharge: 2021-05-06 | Disposition: A | Discharge status: Home / Self Care | Payer: Self-pay | Attending: Emergency Medicine | Admitting: Emergency Medicine

## 2021-05-06 ENCOUNTER — Other Ambulatory Visit: Payer: Self-pay

## 2021-05-06 DIAGNOSIS — M25561 Pain in right knee: Secondary | ICD-10-CM | POA: Insufficient documentation

## 2021-05-06 DIAGNOSIS — F17210 Nicotine dependence, cigarettes, uncomplicated: Secondary | ICD-10-CM | POA: Insufficient documentation

## 2021-05-06 DIAGNOSIS — L03115 Cellulitis of right lower limb: Secondary | ICD-10-CM | POA: Insufficient documentation

## 2021-05-06 DIAGNOSIS — L03119 Cellulitis of unspecified part of limb: Secondary | ICD-10-CM

## 2021-05-06 LAB — CBC WITH DIFFERENTIAL/PLATELET
Abs Immature Granulocytes: 0.03 10*3/uL (ref 0.00–0.07)
Basophils Absolute: 0.1 10*3/uL (ref 0.0–0.1)
Basophils Relative: 1 %
Eosinophils Absolute: 0.2 10*3/uL (ref 0.0–0.5)
Eosinophils Relative: 3 %
HCT: 46.3 % — ABNORMAL HIGH (ref 36.0–46.0)
Hemoglobin: 15.8 g/dL — ABNORMAL HIGH (ref 12.0–15.0)
Immature Granulocytes: 1 %
Lymphocytes Relative: 29 %
Lymphs Abs: 1.9 10*3/uL (ref 0.7–4.0)
MCH: 34 pg (ref 26.0–34.0)
MCHC: 34.1 g/dL (ref 30.0–36.0)
MCV: 99.6 fL (ref 80.0–100.0)
Monocytes Absolute: 0.6 10*3/uL (ref 0.1–1.0)
Monocytes Relative: 8 %
Neutro Abs: 3.9 10*3/uL (ref 1.7–7.7)
Neutrophils Relative %: 58 %
Platelets: 329 10*3/uL (ref 150–400)
RBC: 4.65 MIL/uL (ref 3.87–5.11)
RDW: 13.1 % (ref 11.5–15.5)
WBC: 6.5 10*3/uL (ref 4.0–10.5)
nRBC: 0 % (ref 0.0–0.2)

## 2021-05-06 LAB — URIC ACID: Uric Acid, Serum: 5.8 mg/dL (ref 2.5–7.1)

## 2021-05-06 LAB — BASIC METABOLIC PANEL
Anion gap: 12 (ref 5–15)
BUN: 7 mg/dL (ref 6–20)
CO2: 25 mmol/L (ref 22–32)
Calcium: 9.1 mg/dL (ref 8.9–10.3)
Chloride: 101 mmol/L (ref 98–111)
Creatinine, Ser: 0.84 mg/dL (ref 0.44–1.00)
GFR, Estimated: >60 mL/min (ref >60)
Glucose, Bld: 97 mg/dL (ref 70–99)
Potassium: 3.8 mmol/L (ref 3.5–5.1)
Sodium: 138 mmol/L (ref 135–145)

## 2021-05-06 MED ORDER — OXYCODONE-ACETAMINOPHEN 5-325 MG PO TABS
1.0000 | ORAL_TABLET | Freq: Once | ORAL | Status: AC
  Administered 2021-05-06: 1 via ORAL
  Filled 2021-05-06: qty 1

## 2021-05-06 MED ORDER — HYDROCODONE-ACETAMINOPHEN 5-325 MG PO TABS: 1.0000 | ORAL_TABLET | Freq: Four times a day (QID) | ORAL | 0 refills | Status: DC | PRN

## 2021-05-06 MED ORDER — CEPHALEXIN 500 MG PO CAPS: 500.0000 mg | ORAL_CAPSULE | Freq: Four times a day (QID) | ORAL | 0 refills | Status: AC

## 2021-05-06 NOTE — ED Triage Notes (Signed)
Pt is having pain in her right leg, from the knee cap down. No known injury. No swelling seen.

## 2021-05-06 NOTE — ED Provider Notes (Addendum)
[facility] Emergency Department Provider Note  ____________________________________________   Event Date/Time   First MD Initiated Contact with Patient 05/06/21 [phone]     (approximate)  I have reviewed the triage vital signs and the nursing notes.   HISTORY  Chief Complaint No chief complaint on file.    HPI Becky Dunlap is a 35 y.o. female presents emergency department complaining of right knee pain.  Pain is at the kneecap and radiates to the bottom of the leg.  Increased pain with movement.  States symptoms started about 2 AM.  No history of gout.  No fever or chills  Past Medical History:  Diagnosis Date   Anxiety    on celexa   SVD (spontaneous vaginal delivery) 11/07/2011    Patient Active Problem List   Diagnosis Date Noted   SVD (spontaneous vaginal delivery) 11/07/2011    Past Surgical History:  Procedure Laterality Date   DILATION AND CURETTAGE OF UTERUS  2009    Prior to Admission medications   Medication Sig Start Date End Date Taking? Authorizing Provider  cephALEXin (KEFLEX) 500 MG capsule Take 1 capsule (500 mg total) by mouth 4 (four) times daily for 10 days. 05/06/21 05/16/21 Yes Tanja Gift, Roselyn Bering, PA-C  HYDROcodone-acetaminophen (NORCO/VICODIN) 5-325 MG tablet Take 1 tablet by mouth every 6 (six) hours as needed for moderate pain. 05/06/21  Yes Riaz Onorato, Roselyn Bering, PA-C  albuterol (PROVENTIL HFA;VENTOLIN HFA) 108 (90 Base) MCG/ACT inhaler Inhale 2 puffs into the lungs every 6 (six) hours as needed for wheezing or shortness of breath (cough). 07/26/17   Candis Schatz, PA-C  montelukast (SINGULAIR) 10 MG tablet Take 1 tablet (10 mg total) by mouth at bedtime. 07/26/17   Candis Schatz, PA-C  oseltamivir (TAMIFLU) 75 MG capsule Take 1 capsule (75 mg total) by mouth every 12 (twelve) hours for 5 days. 05/01/21 05/06/21  Shirlee Latch, PA-C  SPRINTEC 28 0.25-35 MG-MCG tablet  01/07/19   [provider]   sulfamethoxazole-trimethoprim (BACTRIM DS) 800-160 MG tablet Take 1 tablet by mouth 2 (two) times daily. 09/08/20   Tommi Rumps, PA-C  cimetidine (CIMETIDINE 200) 200 MG tablet Take 200 mg by mouth 4 (four) times daily.  04/29/19  [provider]    Allergies Patient has no known allergies.  Family History  Problem Relation Age of Onset   Cancer Mother    Anesthesia problems Neg Hx     Social History Social History   Tobacco Use   Smoking status: Every Day    Types: Cigarettes   Smokeless tobacco: Never  Vaping Use   Vaping Use: Never used  Substance Use Topics   Alcohol use: No   Drug use: No    Review of Systems  Constitutional: No fever/chills Eyes: No visual changes. ENT: No sore throat. Respiratory: Denies cough Cardiovascular: Denies chest pain Gastrointestinal: Denies abdominal pain Genitourinary: Negative for dysuria. Musculoskeletal: Negative for back pain.  Positive right knee pain Skin: Negative for rash. Psychiatric: no mood changes,     ____________________________________________   PHYSICAL EXAM:  VITAL SIGNS: ED Triage Vitals [05/06/21 0710]  Enc Vitals Group     BP (!) 141/74     Pulse Rate 79     Resp 16     Temp 98.2 F (36.8 C)     Temp Source Oral     SpO2 100 %     Weight      Height 5\' 6"  (1.676 m)  Head Circumference      Peak Flow      Pain Score 10     Pain Loc      Pain Edu?      Excl. in GC?     Constitutional: Alert and oriented. Well appearing and in no acute distress. Eyes: Conjunctivae are normal.  Head: Atraumatic. Nose: No congestion/rhinnorhea. Mouth/Throat: Mucous membranes are moist.   Neck:  supple no lymphadenopathy noted Cardiovascular: Normal rate, regular rhythm.  Respiratory: Normal respiratory effort.  No retractions,  GU: deferred Musculoskeletal: FROM all extremities, warm and well perfused, right knee is warm to touch, tender at the joint line and posteriorly, neurovascular is  intact, no calf tenderness, distal pulses are intact Neurologic:  Normal speech and language.  Skin:  Skin is warm, dry and intact. No rash noted. Psychiatric: Mood and affect are normal. Speech and behavior are normal.  ____________________________________________   LABS (all labs ordered are listed, but only abnormal results are displayed)  Labs Reviewed  CBC WITH DIFFERENTIAL/PLATELET - Abnormal; Notable for the following components:      Result Value   Hemoglobin 15.8 (*)    HCT 46.3 (*)    All other components within normal limits  BASIC METABOLIC PANEL  URIC ACID   ____________________________________________   ____________________________________________  RADIOLOGY  X-ray of the right knee  ____________________________________________   PROCEDURES  Procedure(s) performed: No  Procedures    ____________________________________________   INITIAL IMPRESSION / ASSESSMENT AND PLAN / ED COURSE  Pertinent labs & imaging results that were available during my care of the patient were reviewed by me and considered in my medical decision making (see chart for details).   Patient is a 35 year old female presents with right knee pain.  See HPI.  Patient appears to be stable  Xray right knee Labs including uric acid  X-ray of the right knee reviewed by me confirmed by radiology to be negative  Labs are reassuring, CBC metabolic panel and uric acid are normal  Ultrasound of right lower extremity for DVT is negative  I did explain all findings to the patient.  Unsure of the cause of her pain.  We will place her on pain medication and antibiotic as there is some cellulitis associated on the knee.  Return emergency department worsening.  Follow-up with orthopedics if not improving in 3 to 4 days.  Patient is in agreement treatment plan.  Discharged in stable condition  EMMIE Dunlap was evaluated in Emergency Department on 05/06/2021 for the symptoms described  in the history of present illness. She was evaluated in the context of the global COVID-19 pandemic, which necessitated consideration that the patient might be at risk for infection with the SARS-CoV-2 virus that causes COVID-19. Institutional protocols and algorithms that pertain to the evaluation of patients at risk for COVID-19 are in a state of rapid change based on information released by regulatory bodies including the CDC and federal and state organizations. These policies and algorithms were followed during the patient's care in the ED.    As part of my medical decision making, I reviewed the following data within the electronic MEDICAL RECORD NUMBER Nursing notes reviewed and incorporated, Labs reviewed , Old chart reviewed, Radiograph reviewed , Notes from prior ED visits, and [place] Controlled Substance Database  ____________________________________________   FINAL CLINICAL IMPRESSION(S) / ED DIAGNOSES  Final diagnoses:  Acute pain of right knee  Cellulitis of lower extremity, unspecified laterality      NEW MEDICATIONS STARTED DURING  THIS VISIT:  New Prescriptions   CEPHALEXIN (KEFLEX) 500 MG CAPSULE    Take 1 capsule (500 mg total) by mouth 4 (four) times daily for 10 days.   HYDROCODONE-ACETAMINOPHEN (NORCO/VICODIN) 5-325 MG TABLET    Take 1 tablet by mouth every 6 (six) hours as needed for moderate pain.     Note:  This document was prepared using Dragon voice recognition software and may include unintentional dictation errors.    Faythe Ghee, PA-C 05/06/21 1100    Sherrie Mustache Roselyn Bering, PA-C 05/06/21 1101    Chesley Noon, MD 05/06/21 [phone]

## 2021-05-06 NOTE — ED Triage Notes (Signed)
Pt tt ED via EMS with right leg pain that started last night, she tried to make it through the night but could not take the pain any longer.

## 2021-09-02 ENCOUNTER — Other Ambulatory Visit: Payer: Self-pay

## 2021-09-02 ENCOUNTER — Ambulatory Visit
Admission: EM | Admit: 2021-09-02 | Discharge: 2021-09-02 | Disposition: A | Discharge status: Home / Self Care | Payer: Managed Care, Other (non HMO) | Attending: Student | Admitting: Student

## 2021-09-02 DIAGNOSIS — N760 Acute vaginitis: Secondary | ICD-10-CM | POA: Insufficient documentation

## 2021-09-02 DIAGNOSIS — B9689 Other specified bacterial agents as the cause of diseases classified elsewhere: Secondary | ICD-10-CM | POA: Diagnosis not present

## 2021-09-02 LAB — WET PREP, GENITAL
Sperm: NONE SEEN
Trich, Wet Prep: NONE SEEN
WBC, Wet Prep HPF POC: <10 (ref <10)
Yeast Wet Prep HPF POC: NONE SEEN

## 2021-09-02 MED ORDER — METRONIDAZOLE 500 MG PO TABS
500.0000 mg | ORAL_TABLET | Freq: Two times a day (BID) | ORAL | 0 refills | Status: DC
Start: 2021-09-02 — End: 2021-12-03

## 2021-09-02 NOTE — ED Triage Notes (Signed)
Here for "STI Testing". "Itching sometimes". Tested recently for UTI "Negative". Using OTC ph balancer "not helping". No dysuria. No urinary frequency/urgency. No fever.  ?

## 2021-09-02 NOTE — Discharge Instructions (Addendum)
-  For bacterial vaginosis, start the antibiotic-Flagyl (metronidazole), 2 pills daily for 7 days.  You can take this with food if you have a sensitive stomach.  Avoid alcohol while taking this medication and for 2 days after as this will cause severe nausea and vomiting. ?-We'll call if positive STI results ?

## 2021-09-02 NOTE — ED Provider Notes (Signed)
?[facility] ? ? ? ?CSN: [id number] ?Arrival date & time: 09/02/21  0936 ? ? ?  ? ?History   ?Chief Complaint ?Chief Complaint  ?Patient presents with  ? STI Testing  ? ? ?HPI ?Becky Dunlap is a 36 y.o. female presenting with vaginal irritation.  History noncontributory.  Describes few weeks of external vaginal irritation and itching following female partner.  States she has already been tested for UTI and this was negative.  Using over-the-counter boric acid with temporary relief.  Denies current discharge, odor, rashes/lesions, dysuria, hematuria.  States her period was 2/25, she cannot be pregnant. ? ?HPI ? ?Past Medical History:  ?Diagnosis Date  ? Anxiety   ? on celexa  ? SVD (spontaneous vaginal delivery) 11/07/2011  ? ? ?Patient Active Problem List  ? Diagnosis Date Noted  ? SVD (spontaneous vaginal delivery) 11/07/2011  ? ? ?Past Surgical History:  ?Procedure Laterality Date  ? DILATION AND CURETTAGE OF UTERUS  2009  ? ? ?OB History   ? ? Gravida  ?6  ? Para  ?1  ? Term  ?1  ? Preterm  ?0  ? AB  ?1  ? Living  ?1  ?  ? ? SAB  ?1  ? IAB  ?0  ? Ectopic  ?0  ? Multiple  ?0  ? Live Births  ?1  ?   ?  ?  ? ? ? ?Home Medications   ? ?Prior to Admission medications   ?Medication Sig Start Date End Date Taking? Authorizing Provider  ?metroNIDAZOLE (FLAGYL) 500 MG tablet Take 1 tablet (500 mg total) by mouth 2 (two) times daily. Avoid alcohol while taking this medication and for 2 days after 09/02/21  Yes Rhys Martini, PA-C  ?albuterol (PROVENTIL HFA;VENTOLIN HFA) 108 (90 Base) MCG/ACT inhaler Inhale 2 puffs into the lungs every 6 (six) hours as needed for wheezing or shortness of breath (cough). 07/26/17   Candis Schatz, PA-C  ?HYDROcodone-acetaminophen (NORCO/VICODIN) 5-325 MG tablet Take 1 tablet by mouth every 6 (six) hours as needed for moderate pain. 05/06/21   Fisher, Roselyn Bering, PA-C  ?montelukast (SINGULAIR) 10 MG tablet Take 1 tablet (10 mg total) by mouth at bedtime. 07/26/17   Candis Schatz, PA-C  ?SPRINTEC 28 0.25-35 MG-MCG tablet  01/07/19   [provider]  ?sulfamethoxazole-trimethoprim (BACTRIM DS) 800-160 MG tablet Take 1 tablet by mouth 2 (two) times daily. 09/08/20   Tommi Rumps, PA-C  ?cimetidine (CIMETIDINE 200) 200 MG tablet Take 200 mg by mouth 4 (four) times daily.  04/29/19  [provider]  ? ? ?Family History ?Family History  ?Problem Relation Age of Onset  ? Cancer Mother   ? Anesthesia problems Neg Hx   ? ? ?Social History ?Social History  ? ?Tobacco Use  ? Smoking status: Every Day  ?  Types: Cigarettes  ? Smokeless tobacco: Never  ?Vaping Use  ? Vaping Use: Never used  ?Substance Use Topics  ? Alcohol use: No  ? Drug use: No  ? ? ? ?Allergies   ?Patient has no known allergies. ? ? ?Review of Systems ?Review of Systems  ?Constitutional:  Negative for chills and fever.  ?HENT:  Negative for sore throat.   ?Eyes:  Negative for pain and redness.  ?Respiratory:  Negative for shortness of breath.   ?Cardiovascular:  Negative for chest pain.  ?Gastrointestinal:  Negative for abdominal pain, diarrhea, nausea and vomiting.  ?Genitourinary:  Negative for decreased urine volume,  difficulty urinating, dysuria, flank pain, frequency, genital sores, hematuria, urgency, vaginal bleeding, vaginal discharge and vaginal pain.  ?Musculoskeletal:  Negative for back pain.  ?Skin:  Negative for rash.  ?All other systems reviewed and are negative. ? ? ?Physical Exam ?Triage Vital Signs ?ED Triage Vitals  ?Enc Vitals Group  ?   BP 09/02/21 0952 120/81  ?   Pulse Rate 09/02/21 0952 90  ?   Resp 09/02/21 0952 18  ?   Temp 09/02/21 0952 98.7 ?F (37.1 ?C)  ?   Temp Source 09/02/21 0952 Oral  ?   SpO2 09/02/21 0952 97 %  ?   Weight 09/02/21 0950 205 lb (93 kg)  ?   Height 09/02/21 0950 5\' 6"  (1.676 m)  ?   Head Circumference --   ?   Peak Flow --   ?   Pain Score 09/02/21 0948 0  ?   Pain Loc --   ?   Pain Edu? --   ?   Excl. in GC? --   ? ?No data found. ? ?Updated Vital Signs ?BP  120/81 (BP Location: Left Arm)   Pulse 90   Temp 98.7 ?F (37.1 ?C) (Oral)   Resp 18   Ht 5\' 6"  (1.676 m)   Wt 205 lb (93 kg)   LMP  (LMP Unknown)   SpO2 97%   BMI 33.09 kg/m?  ? ?Visual Acuity ?Right Eye Distance:   ?Left Eye Distance:   ?Bilateral Distance:   ? ?Right Eye Near:   ?Left Eye Near:    ?Bilateral Near:    ? ?Physical Exam ?Vitals reviewed.  ?Constitutional:   ?   General: She is not in acute distress. ?   Appearance: Normal appearance. She is not ill-appearing.  ?HENT:  ?   Head: Normocephalic and atraumatic.  ?   Mouth/Throat:  ?   Mouth: Mucous membranes are moist.  ?   Comments: Moist mucous membranes ?Eyes:  ?   Extraocular Movements: Extraocular movements intact.  ?   Pupils: Pupils are equal, round, and reactive to light.  ?Cardiovascular:  ?   Rate and Rhythm: Normal rate and regular rhythm.  ?   Heart sounds: Normal heart sounds.  ?Pulmonary:  ?   Effort: Pulmonary effort is normal.  ?   Breath sounds: Normal breath sounds. No wheezing, rhonchi or rales.  ?Abdominal:  ?   General: Bowel sounds are normal. There is no distension.  ?   Palpations: Abdomen is soft. There is no mass.  ?   Tenderness: There is no abdominal tenderness. There is no right CVA tenderness, left CVA tenderness, guarding or rebound.  ?Genitourinary: ?   Comments: deferred ?Skin: ?   General: Skin is warm.  ?   Capillary Refill: Capillary refill takes less than 2 seconds.  ?   Comments: Good skin turgor  ?Neurological:  ?   General: No focal deficit present.  ?   Mental Status: She is alert and oriented to person, place, and time.  ?Psychiatric:     ?   Mood and Affect: Mood normal.     ?   Behavior: Behavior normal.  ? ? ? ?UC Treatments / Results  ?Labs ?(all labs ordered are listed, but only abnormal results are displayed) ?Labs Reviewed  ?WET PREP, GENITAL - Abnormal; Notable for the following components:  ?    Result Value  ? Clue Cells Wet Prep HPF POC PRESENT (*)   ? All other components within  normal limits   ?CERVICOVAGINAL ANCILLARY ONLY  ? ? ?EKG ? ? ?Radiology ?No results found. ? ?Procedures ?Procedures (including critical care time) ? ?Medications Ordered in UC ?Medications - No data to display ? ?Initial Impression / Assessment and Plan / UC Course  ?I have reviewed the triage vital signs and the nursing notes. ? ?Pertinent labs & imaging results that were available during my care of the patient were reviewed by me and considered in my medical decision making (see chart for details). ? ?  ? ?This patient is a very pleasant 36 y.o. year old female presenting with BV. Afebrile, nontachycardic, no reproducible abd pain or CVAT. LMP 2/25, States she is not pregnant or breastfeeding, declines u-preg.  ? ?Wet prep with clue cells.  ?New female partner. Will send self-swab for G/C, trich. Declines HIV, RPR. Safe sex precautions.  ? ?ED return precautions discussed. Patient verbalizes understanding and agreement. She is a CMA. ? ?Final Clinical Impressions(s) / UC Diagnoses  ? ?Final diagnoses:  ?Bacterial vaginitis  ? ? ? ?Discharge Instructions   ? ?  ?-For bacterial vaginosis, start the antibiotic-Flagyl (metronidazole), 2 pills daily for 7 days.  You can take this with food if you have a sensitive stomach.  Avoid alcohol while taking this medication and for 2 days after as this will cause severe nausea and vomiting. ?-We'll call if positive STI results ? ? ?ED Prescriptions   ? ? Medication Sig Dispense Auth. Provider  ? metroNIDAZOLE (FLAGYL) 500 MG tablet Take 1 tablet (500 mg total) by mouth 2 (two) times daily. Avoid alcohol while taking this medication and for 2 days after 14 tablet Rhys Martini, PA-C  ? ?  ? ?PDMP not reviewed this encounter. ?  ?Rhys Martini, PA-C ?09/02/21 1028 ? ?

## 2021-09-03 LAB — CERVICOVAGINAL ANCILLARY ONLY
Chlamydia: NEGATIVE
Comment: NEGATIVE
Comment: NEGATIVE
Comment: NORMAL
Neisseria Gonorrhea: NEGATIVE
Trichomonas: POSITIVE — AB

## 2021-12-03 ENCOUNTER — Ambulatory Visit
Admission: EM | Admit: 2021-12-03 | Discharge: 2021-12-03 | Disposition: A | Discharge status: Home / Self Care | Payer: Managed Care, Other (non HMO) | Attending: Physician Assistant | Admitting: Physician Assistant

## 2021-12-03 DIAGNOSIS — Z202 Contact with and (suspected) exposure to infections with a predominantly sexual mode of transmission: Secondary | ICD-10-CM | POA: Insufficient documentation

## 2021-12-03 DIAGNOSIS — N760 Acute vaginitis: Secondary | ICD-10-CM | POA: Insufficient documentation

## 2021-12-03 DIAGNOSIS — B9689 Other specified bacterial agents as the cause of diseases classified elsewhere: Secondary | ICD-10-CM | POA: Diagnosis present

## 2021-12-03 LAB — WET PREP, GENITAL
Sperm: NONE SEEN
Trich, Wet Prep: NONE SEEN
WBC, Wet Prep HPF POC: <10 (ref <10)
Yeast Wet Prep HPF POC: NONE SEEN

## 2021-12-03 LAB — HIV ANTIBODY (ROUTINE TESTING W REFLEX): HIV Screen 4th Generation wRfx: NONREACTIVE

## 2021-12-03 LAB — PREGNANCY, URINE: Preg Test, Ur: NEGATIVE

## 2021-12-03 MED ORDER — METRONIDAZOLE 500 MG PO TABS: 500.0000 mg | ORAL_TABLET | Freq: Two times a day (BID) | ORAL | 0 refills | Status: AC

## 2021-12-03 NOTE — Discharge Instructions (Addendum)
-  Trichomonas testing negative.  BV test positive.  Those conditions are treated with the same antibiotic.  I sent metronidazole to pharmacy.  Take full course. - Use unscented body washes and wipes or pH balance washes wipes. - Practice good hygiene and safe sex. - The results of your other testing should be back in the next day or 2.  We will call with any positive results and suggest further treatment from there.

## 2021-12-03 NOTE — ED Provider Notes (Signed)
[facility]    CSN: [id number] Arrival date & time: 12/03/21  0841      History   Chief Complaint Chief Complaint  Patient presents with   Exposure to STD    HPI Becky Dunlap is a 36 y.o. female presenting for STI screening.  Patient reports that her former partner revealed to her that he was unfaithful and he recently got tested for STIs and had trichomonas.  She is concerned about the possibility of that but would also like further STI testing to include HIV and syphilis.  She reports some mild vaginal irritation that began yesterday.  Patient was reports that this is not abnormal for her during her menses.  She says that she is still spotting a little.  She denies any vaginal odor or discharge, dysuria, urinary frequency or urgency.  No abdominal/pelvic pain, nausea/vomiting or diarrhea.  She says her last sexual encounter with her previous partner was about a month ago and the condom did break at that time.  She has no other complaints.  HPI  Past Medical History:  Diagnosis Date   Anxiety    on celexa   SVD (spontaneous vaginal delivery) 11/07/2011    Patient Active Problem List   Diagnosis Date Noted   SVD (spontaneous vaginal delivery) 11/07/2011    Past Surgical History:  Procedure Laterality Date   DILATION AND CURETTAGE OF UTERUS  2009    OB History     Gravida  6   Para  1   Term  1   Preterm  0   AB  1   Living  1      SAB  1   IAB  0   Ectopic  0   Multiple  0   Live Births  1            Home Medications    Prior to Admission medications   Medication Sig Start Date End Date Taking? Authorizing Provider  albuterol (PROVENTIL HFA;VENTOLIN HFA) 108 (90 Base) MCG/ACT inhaler Inhale 2 puffs into the lungs every 6 (six) hours as needed for wheezing or shortness of breath (cough). 07/26/17  Yes Candis Schatz, PA-C  montelukast (SINGULAIR) 10 MG tablet Take 1 tablet (10 mg total) by mouth at bedtime. 07/26/17  Yes  Candis Schatz PA-C  SPRINTEC 28 0.25-35 MG-MCG tablet  01/07/19  Yes [provider]  metroNIDAZOLE (FLAGYL) 500 MG tablet Take 1 tablet (500 mg total) by mouth 2 (two) times daily for 7 days. Avoid alcohol while taking this medication and for 2 days after 12/03/21 12/10/21  Shirlee Latch, PA-C  cimetidine (CIMETIDINE 200) 200 MG tablet Take 200 mg by mouth 4 (four) times daily.  04/29/19  [provider]    Family History Family History  Problem Relation Age of Onset   Cancer Mother    Anesthesia problems Neg Hx     Social History Social History   Tobacco Use   Smoking status: Every Day    Types: Cigarettes   Smokeless tobacco: Never  Vaping Use   Vaping Use: Never used  Substance Use Topics   Alcohol use: Not Currently   Drug use: No     Allergies   Patient has no known allergies.   Review of Systems Review of Systems  Constitutional:  Negative for fatigue and fever.  Gastrointestinal:  Negative for abdominal pain, constipation, diarrhea, nausea and vomiting.  Genitourinary:  Negative for difficulty urinating, dysuria, frequency, menstrual  problem and vaginal discharge.       +vaginal irritation  Skin:  Negative for rash.    Physical Exam Triage Vital Signs ED Triage Vitals [12/03/21 0858]  Enc Vitals Group     BP      Pulse      Resp      Temp      Temp src      SpO2      Weight 210 lb (95.3 kg)     Height 5\' 6"  (1.676 m)     Head Circumference      Peak Flow      Pain Score 0     Pain Loc      Pain Edu?      Excl. in GC?    No data found.  Updated Vital Signs BP (!) 137/95 (BP Location: Left Arm)   Pulse 80   Temp 98.7 F (37.1 C) (Oral)   Resp 18   Ht 5\' 6"  (1.676 m)   Wt 210 lb (95.3 kg)   LMP 12/02/2021   SpO2 100%   BMI 33.89 kg/m       Physical Exam Vitals and nursing note reviewed.  Constitutional:      General: She is not in acute distress.    Appearance: Normal appearance. She is not ill-appearing or  toxic-appearing.  HENT:     Head: Normocephalic and atraumatic.  Eyes:     General: No scleral icterus.       Right eye: No discharge.        Left eye: No discharge.     Conjunctiva/sclera: Conjunctivae normal.  Cardiovascular:     Rate and Rhythm: Normal rate and regular rhythm.     Heart sounds: Normal heart sounds.  Pulmonary:     Effort: Pulmonary effort is normal. No respiratory distress.     Breath sounds: Normal breath sounds.  Abdominal:     Palpations: Abdomen is soft.     Tenderness: There is no abdominal tenderness.  Musculoskeletal:     Cervical back: Neck supple.  Skin:    General: Skin is dry.  Neurological:     General: No focal deficit present.     Mental Status: She is alert. Mental status is at baseline.     Motor: No weakness.     Gait: Gait normal.  Psychiatric:        Mood and Affect: Mood normal.        Behavior: Behavior normal.        Thought Content: Thought content normal.     UC Treatments / Results  Labs (all labs ordered are listed, but only abnormal results are displayed) Labs Reviewed  WET PREP, GENITAL - Abnormal; Notable for the following components:      Result Value   Clue Cells Wet Prep HPF POC PRESENT (*)    All other components within normal limits  PREGNANCY, URINE  RPR  HIV ANTIBODY (ROUTINE TESTING W REFLEX)  CERVICOVAGINAL ANCILLARY ONLY    EKG   Radiology No results found.  Procedures Procedures (including critical care time)  Medications Ordered in UC Medications - No data to display  Initial Impression / Assessment and Plan / UC Course  I have reviewed the triage vital signs and the nursing notes.  Pertinent labs & imaging results that were available during my care of the patient were reviewed by me and considered in my medical decision making (see chart for details).  36 year old female presenting  for STI screening.  Reports her previous partner revealed to her that he was positive for trichomonas recently.   They did have a sexual encounter about a month ago and the condom broke at that time.  Only symptoms that she is having now is some mild vaginal irritation and she is currently on her menstrual period.  Patient elects perform vaginal self swab.  Wet prep obtained as well as GC/chlamydia, RPR and HIV testing.  Urine pregnancy also obtained.  Negative urine pregnancy.  Negative trichomonas test.  Positive clue cells.  Will treat for BV with metronidazole.  Discussed good hygiene and safe sex.  Reviewed how to access results of other testing and advised we will call with any positive results changes suggest further treatment from there.   Final Clinical Impressions(s) / UC Diagnoses   Final diagnoses:  Bacterial vaginosis  Possible exposure to STD     Discharge Instructions      -Trichomonas testing negative.  BV test positive.  Those conditions are treated with the same antibiotic.  I sent metronidazole to pharmacy.  Take full course. - Use unscented body washes and wipes or pH balance washes wipes. - Practice good hygiene and safe sex. - The results of your other testing should be back in the next day or 2.  We will call with any positive results and suggest further treatment from there.     ED Prescriptions     Medication Sig Dispense Auth. Provider   metroNIDAZOLE (FLAGYL) 500 MG tablet Take 1 tablet (500 mg total) by mouth 2 (two) times daily for 7 days. Avoid alcohol while taking this medication and for 2 days after 14 tablet Shirlee Latch, PA-C      PDMP not reviewed this encounter.   Shirlee Latch, PA-C 12/03/21 [phone]

## 2021-12-03 NOTE — ED Triage Notes (Signed)
Pt would like to be STD tested. Pt had a partner who stated they had Trichomonas and pt is concerned.   Pt asks for HIV testing as well.   Pt had vaginal irritation starting on Yesterday.  Pt states that she came off her menstrual cycle yesterday and that can cause irritation.

## 2021-12-04 LAB — CERVICOVAGINAL ANCILLARY ONLY
Chlamydia: NEGATIVE
Comment: NEGATIVE
Comment: NORMAL
Neisseria Gonorrhea: NEGATIVE

## 2021-12-04 LAB — RPR: RPR Ser Ql: NONREACTIVE

## 2021-12-13 IMAGING — CR DG KNEE COMPLETE 4+V*R*
4 series · 4 of 4 positions shown · non-contrast
Comparison: None.

CLINICAL DATA: Pain and swelling

EXAM:
RIGHT KNEE - COMPLETE 4+ VIEW

[knee ap]
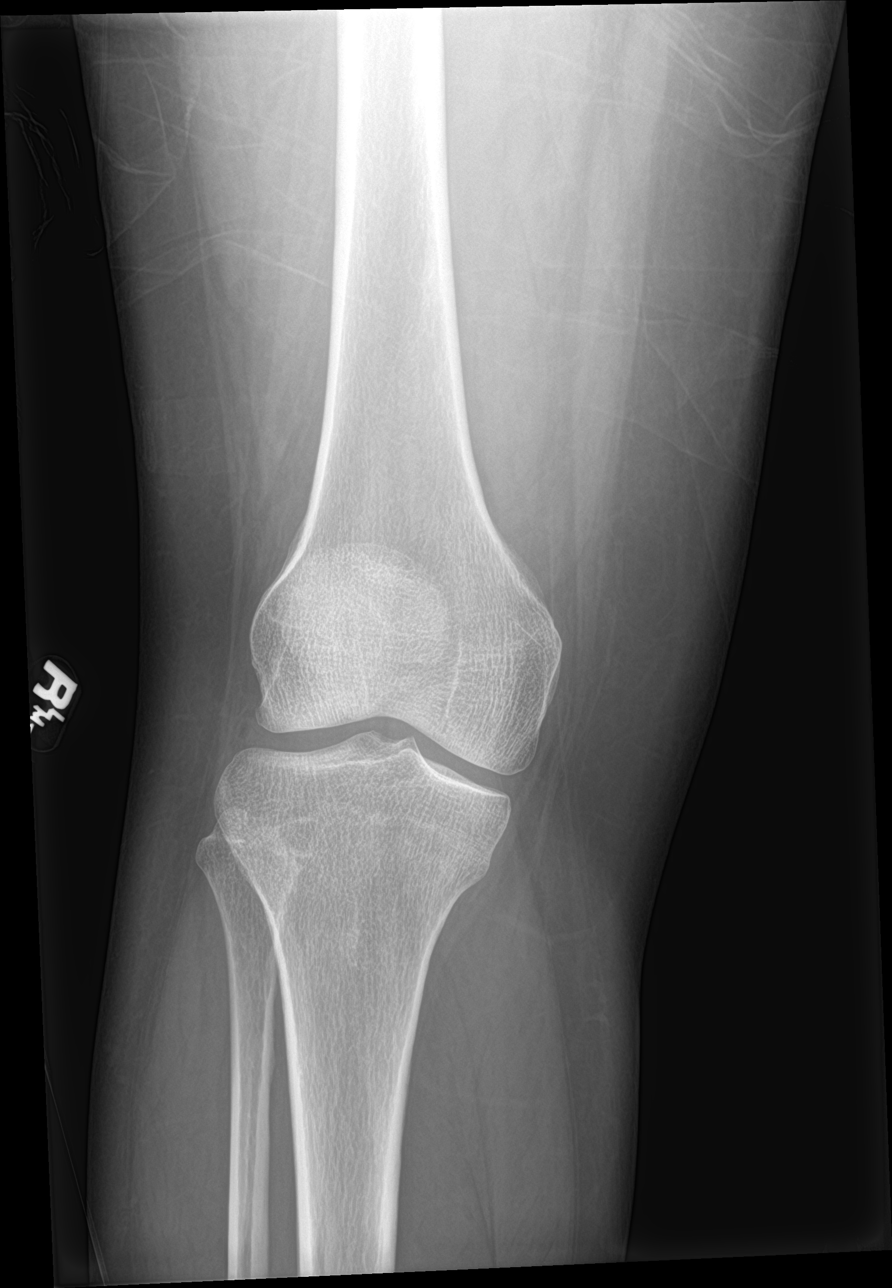

[knee obl (1 of 2)]
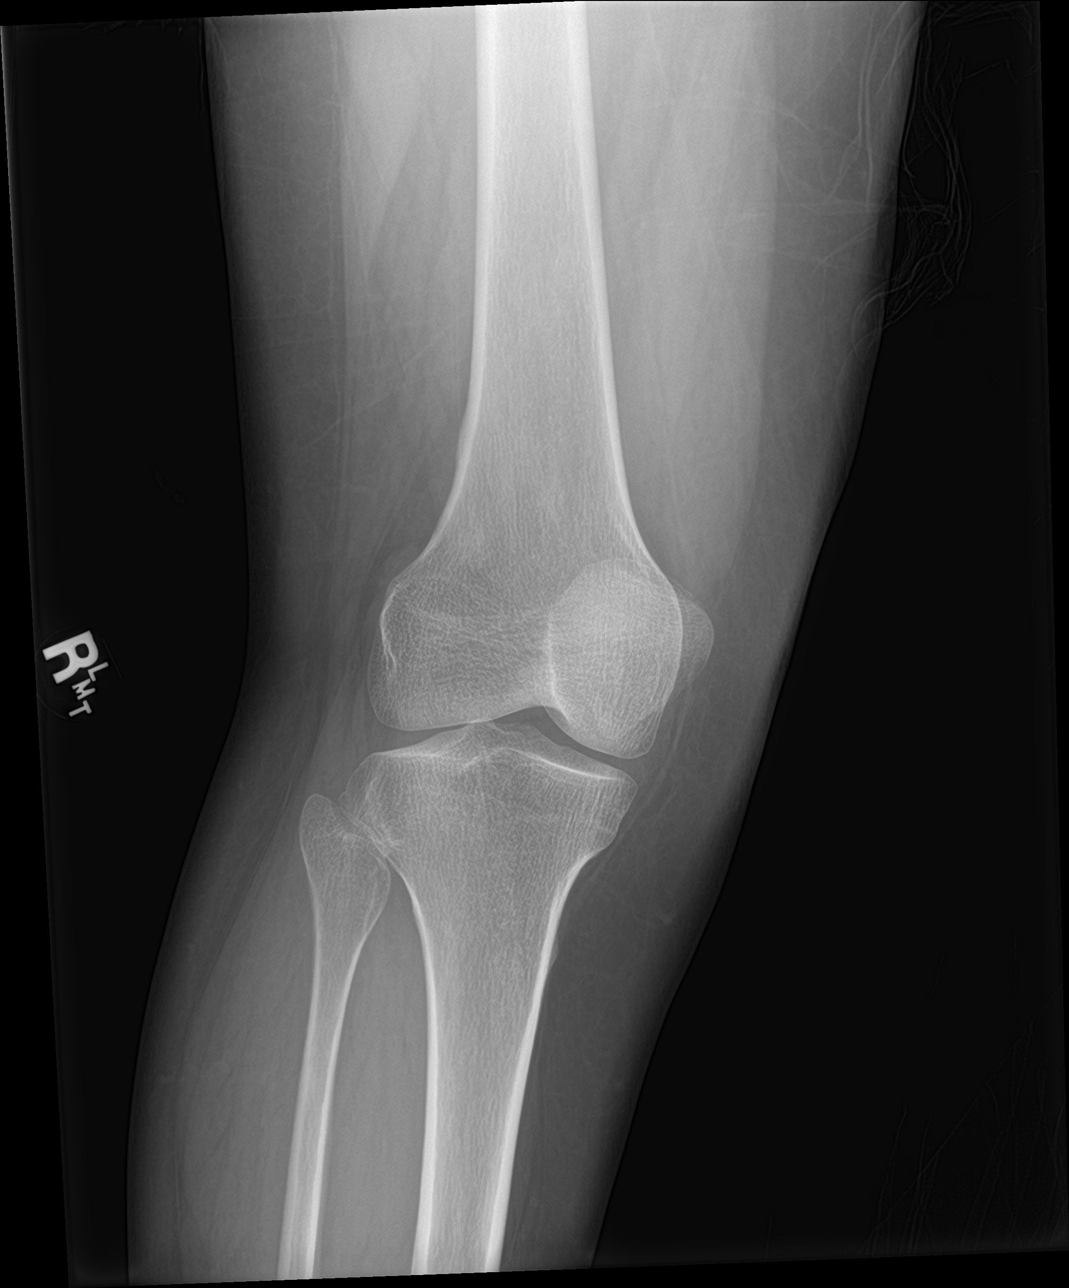

[knee obl (2 of 2)]
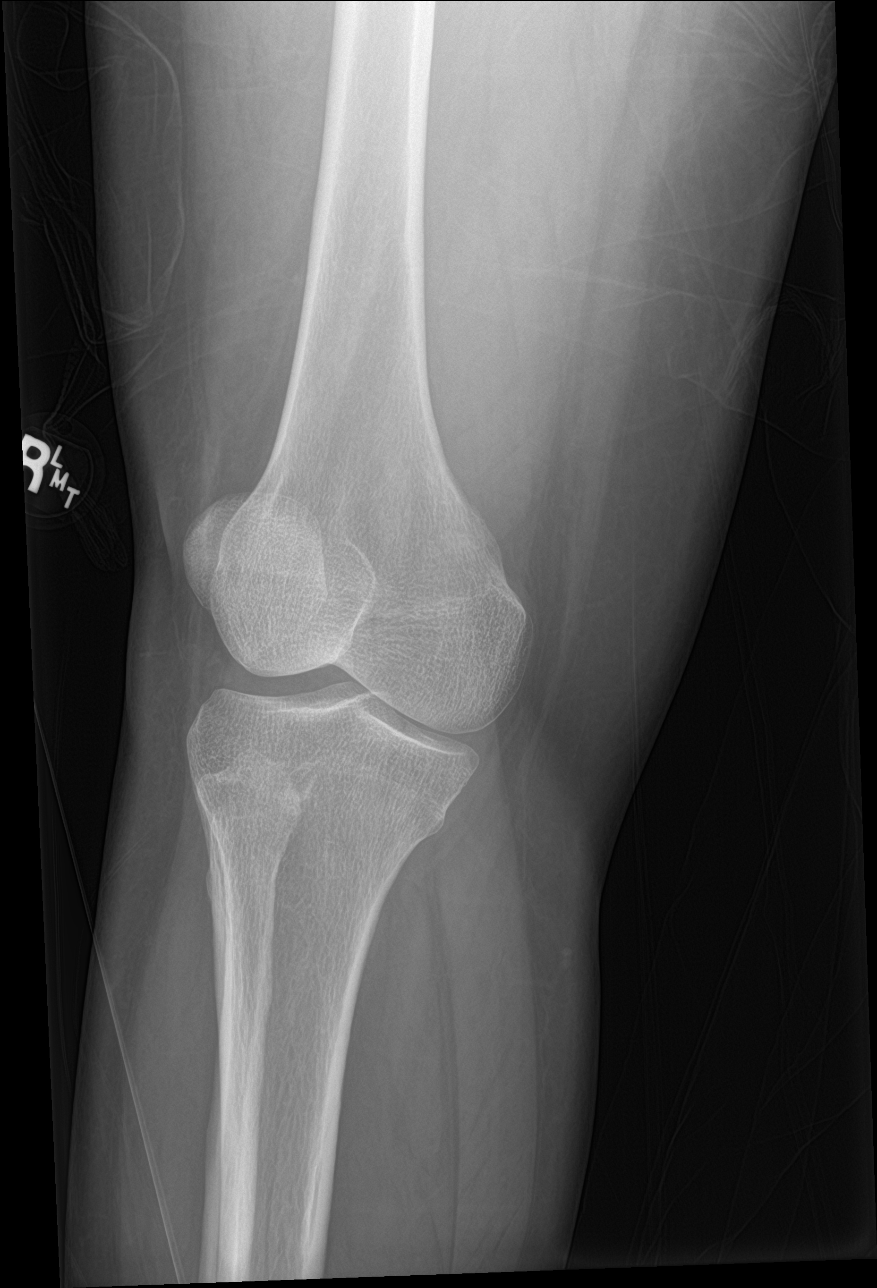

[knee lat]
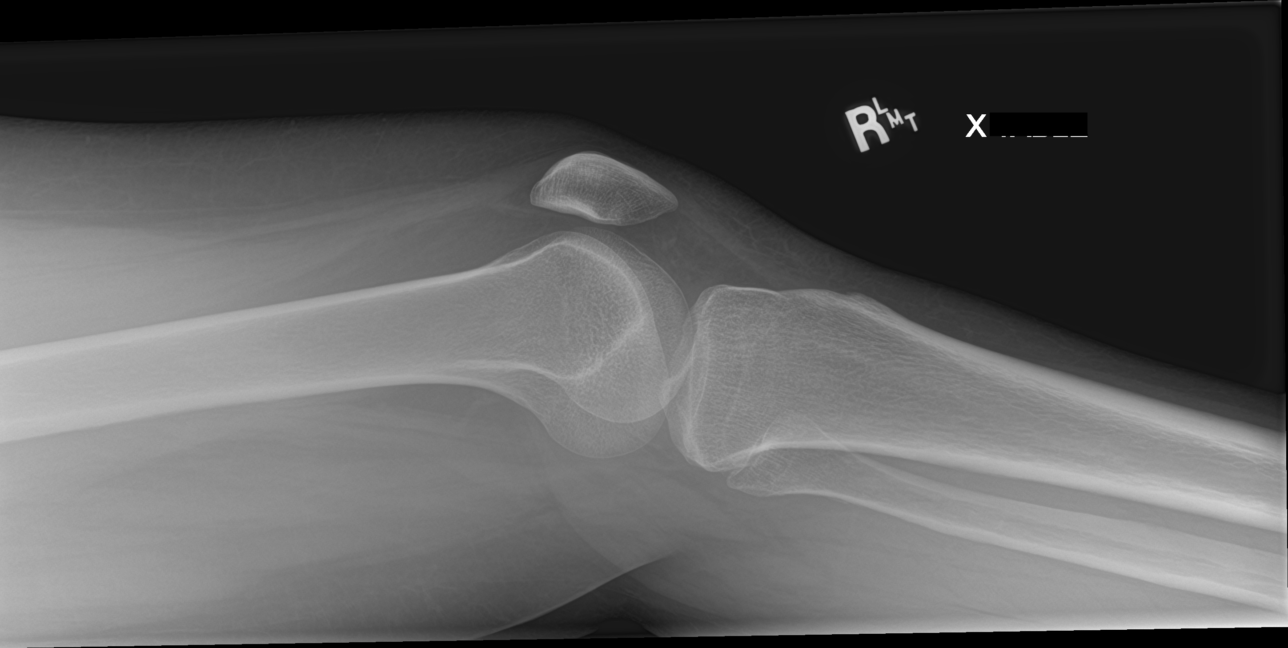

[4 of 4 positions shown; findings below may reference images not displayed]

FINDINGS: No evidence of fracture, dislocation, or joint effusion. No evidence
of arthropathy or other focal bone abnormality. Soft tissues are
unremarkable.
IMPRESSION: No radiographic abnormality is seen in right knee.

## 2022-03-19 ENCOUNTER — Other Ambulatory Visit: Payer: Self-pay

## 2022-03-19 DIAGNOSIS — R1011 Right upper quadrant pain: Secondary | ICD-10-CM

## 2022-04-18 ENCOUNTER — Encounter: Payer: Self-pay | Admitting: Obstetrics and Gynecology

## 2022-04-22 ENCOUNTER — Ambulatory Visit
Admission: EM | Admit: 2022-04-22 | Discharge: 2022-04-22 | Disposition: A | Discharge status: Home / Self Care | Payer: Managed Care, Other (non HMO) | Attending: Physician Assistant | Admitting: Physician Assistant

## 2022-04-22 DIAGNOSIS — R5383 Other fatigue: Secondary | ICD-10-CM | POA: Diagnosis present

## 2022-04-22 DIAGNOSIS — R051 Acute cough: Secondary | ICD-10-CM | POA: Insufficient documentation

## 2022-04-22 DIAGNOSIS — B349 Viral infection, unspecified: Secondary | ICD-10-CM | POA: Diagnosis present

## 2022-04-22 DIAGNOSIS — Z1152 Encounter for screening for COVID-19: Secondary | ICD-10-CM | POA: Diagnosis not present

## 2022-04-22 LAB — RESP PANEL BY RT-PCR (FLU A&B, COVID) ARPGX2
Influenza A by PCR: NEGATIVE
Influenza B by PCR: NEGATIVE
SARS Coronavirus 2 by RT PCR: NEGATIVE

## 2022-04-22 MED ORDER — GUAIFENESIN-CODEINE 100-10 MG/5ML PO SYRP: 10.0000 mL | ORAL_SOLUTION | Freq: Three times a day (TID) | ORAL | 0 refills | Status: DC | PRN

## 2022-04-22 NOTE — ED Triage Notes (Signed)
Pt c/o congestion, chest pain from coughing, body chills, fatigue x1week

## 2022-04-22 NOTE — ED Provider Notes (Signed)
[facility]    CSN: [id number] Arrival date & time: 04/22/22  1125      History   Chief Complaint Chief Complaint  Patient presents with   Cough   Fatigue   Generalized Body Aches    HPI Becky Dunlap is a 36 y.o. female presenting with her daughter (who is also sick) for cough and congestion as well as body aches and chills for the past 1 week.  She also reports being very fatigued and having a sore throat.  Patient says the congestion has gotten worse over the past 3 days.  Reports temps up to 101 degrees.  Has been taking over-the-counter cough medication but says it is not effective and she cannot sleep at night.  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  No other complaints.  HPI  Past Medical History:  Diagnosis Date   Anxiety    on celexa   SVD (spontaneous vaginal delivery) 11/07/2011    Patient Active Problem List   Diagnosis Date Noted   SVD (spontaneous vaginal delivery) 11/07/2011    Past Surgical History:  Procedure Laterality Date   DILATION AND CURETTAGE OF UTERUS  2009    OB History     Gravida  6   Para  1   Term  1   Preterm  0   AB  1   Living  1      SAB  1   IAB  0   Ectopic  0   Multiple  0   Live Births  1            Home Medications    Prior to Admission medications   Medication Sig Start Date End Date Taking? Authorizing Provider  albuterol (PROVENTIL HFA;VENTOLIN HFA) 108 (90 Base) MCG/ACT inhaler Inhale 2 puffs into the lungs every 6 (six) hours as needed for wheezing or shortness of breath (cough). 07/26/17  Yes Luvenia Redden, PA-C  guaiFENesin-codeine (ROBITUSSIN AC) 100-10 MG/5ML syrup Take 10 mLs by mouth 3 (three) times daily as needed for cough. 04/22/22  Yes Laurene Footman B, PA-C  montelukast (SINGULAIR) 10 MG tablet Take 1 tablet (10 mg total) by mouth at bedtime. 07/26/17  Yes Luvenia Redden PA-C  SPRINTEC 28  0.25-35 MG-MCG tablet  01/07/19  Yes [provider]  cimetidine (CIMETIDINE 200) 200 MG tablet Take 200 mg by mouth 4 (four) times daily.  04/29/19  [provider]    Family History Family History  Problem Relation Age of Onset   Cancer Mother    Anesthesia problems Neg Hx     Social History Social History   Tobacco Use   Smoking status: Every Day    Types: Cigarettes   Smokeless tobacco: Never  Vaping Use   Vaping Use: Never used  Substance Use Topics   Alcohol use: Not Currently   Drug use: No     Allergies   Patient has no known allergies.   Review of Systems Review of Systems  Constitutional:  Positive for chills, fatigue and fever. Negative for diaphoresis.  HENT:  Positive for congestion, rhinorrhea and sore throat. Negative for ear pain, sinus pressure and sinus pain.   Respiratory:  Positive for cough. Negative for shortness of breath.   Cardiovascular:  Negative for chest pain.  Gastrointestinal:  Negative for abdominal pain, nausea and vomiting.  Musculoskeletal:  Positive for myalgias. Negative  for arthralgias.  Skin:  Negative for rash.  Neurological:  Positive for headaches. Negative for weakness.  Hematological:  Negative for adenopathy.     Physical Exam Triage Vital Signs ED Triage Vitals  Enc Vitals Group     BP      Pulse      Resp      Temp      Temp src      SpO2      Weight      Height      Head Circumference      Peak Flow      Pain Score      Pain Loc      Pain Edu?      Excl. in [place]?    No data found.  Updated Vital Signs BP 125/89 (BP Location: Left Arm)   Pulse 81   Temp 98 F (36.7 C) (Oral)   Resp 18   Ht 5\' 6"  (1.676 m)   Wt 204 lb 1.6 oz (92.6 kg)   LMP 04/02/2022   SpO2 98%   BMI 32.94 kg/m     Physical Exam Vitals and nursing note reviewed.  Constitutional:      General: She is not in acute distress.    Appearance: Normal appearance. She is ill-appearing. She is not toxic-appearing.   HENT:     Head: Normocephalic and atraumatic.     Nose: Congestion present.     Mouth/Throat:     Mouth: Mucous membranes are moist.     Pharynx: Oropharynx is clear. Posterior oropharyngeal erythema present.  Eyes:     General: No scleral icterus.       Right eye: No discharge.        Left eye: No discharge.     Conjunctiva/sclera: Conjunctivae normal.  Cardiovascular:     Rate and Rhythm: Normal rate and regular rhythm.     Heart sounds: Normal heart sounds.  Pulmonary:     Effort: Pulmonary effort is normal. No respiratory distress.     Breath sounds: Normal breath sounds.  Musculoskeletal:     Cervical back: Neck supple.  Skin:    General: Skin is dry.  Neurological:     General: No focal deficit present.     Mental Status: She is alert. Mental status is at baseline.     Motor: No weakness.     Gait: Gait normal.  Psychiatric:        Mood and Affect: Mood normal.        Behavior: Behavior normal.        Thought Content: Thought content normal.      UC Treatments / Results  Labs (all labs ordered are listed, but only abnormal results are displayed) Labs Reviewed  RESP PANEL BY RT-PCR (FLU A&B, COVID) [id number]    EKG   Radiology No results found.  Procedures Procedures (including critical care time)  Medications Ordered in UC Medications - No data to display  Initial Impression / Assessment and Plan / UC Course  I have reviewed the triage vital signs and the nursing notes.  Pertinent labs & imaging results that were available during my care of the patient were reviewed by me and considered in my medical decision making (see chart for details).   36 year old female presenting for cough, congestion, fatigue, aches over the past week.  Over the past 3 days the congestion has gotten worse and she has had temps up to 101 degrees.  Her  daughter is ill with similar symptoms.  Patient has been taking over-the-counter cough medicine without relief.  Vitals normal  stable patient is nontoxic-appearing.  She does appear ill.  Exam shows nasal congestion and erythema posterior pharynx.  Chest is clear to auscultation heart regular rate and rhythm.  Respiratory panel obtained.  Advised patient we will contact her with the results if positive.  Advised if she does not hear from [place], testing is negative and she has another virus.  Reviewed current CDC guidelines, isolation protocol and ED precautions if COVID-positive.  Supportive care.  Sent Cheratussin to pharmacy for cough for her to try at night.  Increase rest and fluids.  Reviewed returning if fever is not breaking or she has worsening cough or breathing difficulty.  Negative resp panel.   Final Clinical Impressions(s) / UC Diagnoses   Final diagnoses:  Viral illness  Acute cough  Other fatigue     Discharge Instructions      We will call if positive flu or COVID test and recommend further treatment from there.  If positive COVID you will need to isolate 5 days and wear mask for 5 days.  If you not hear from [place], the testing is negative and you may have another virus.  You should be feeling better over the next week.  URI/COLD SYMPTOMS: Your exam today is consistent with a viral illness. Antibiotics are not indicated at this time. Use medications as directed, including cough syrup, nasal saline, and decongestants. Your symptoms should improve over the next few days and resolve within 7-10 days. Increase rest and fluids. F/u if symptoms worsen or predominate such as sore throat, ear pain, productive cough, shortness of breath, or if you develop high fevers or worsening fatigue over the next several days.       ED Prescriptions     Medication Sig Dispense Auth. Provider   guaiFENesin-codeine (ROBITUSSIN AC) 100-10 MG/5ML syrup Take 10 mLs by mouth 3 (three) times daily as needed for cough. 120 mL Danton Clap, PA-C      I have reviewed the PDMP during this encounter.   Danton Clap,  PA-C 04/22/22 1426

## 2022-04-22 NOTE — Discharge Instructions (Addendum)
We will call if positive flu or COVID test and recommend further treatment from there.  If positive COVID you will need to isolate 5 days and wear mask for 5 days.  If you not hear from [place], the testing is negative and you may have another virus.  You should be feeling better over the next week.  URI/COLD SYMPTOMS: Your exam today is consistent with a viral illness. Antibiotics are not indicated at this time. Use medications as directed, including cough syrup, nasal saline, and decongestants. Your symptoms should improve over the next few days and resolve within 7-10 days. Increase rest and fluids. F/u if symptoms worsen or predominate such as sore throat, ear pain, productive cough, shortness of breath, or if you develop high fevers or worsening fatigue over the next several days.

## 2022-06-03 ENCOUNTER — Emergency Department
Admission: EM | Admit: 2022-06-03 | Discharge: 2022-06-03 | Disposition: A | Discharge status: Home / Self Care | Payer: Managed Care, Other (non HMO)

## 2022-06-06 ENCOUNTER — Ambulatory Visit: Payer: Self-pay

## 2023-02-04 ENCOUNTER — Encounter: Payer: Self-pay | Admitting: Emergency Medicine

## 2023-02-04 ENCOUNTER — Emergency Department: Payer: Managed Care, Other (non HMO)

## 2023-02-04 ENCOUNTER — Other Ambulatory Visit: Payer: Self-pay

## 2023-02-04 ENCOUNTER — Emergency Department
Admission: EM | Admit: 2023-02-04 | Discharge: 2023-02-04 | Disposition: A | Discharge status: Home / Self Care | Payer: Medicaid Other | Attending: Emergency Medicine | Admitting: Emergency Medicine

## 2023-02-04 DIAGNOSIS — K449 Diaphragmatic hernia without obstruction or gangrene: Secondary | ICD-10-CM | POA: Diagnosis not present

## 2023-02-04 DIAGNOSIS — R1010 Upper abdominal pain, unspecified: Secondary | ICD-10-CM | POA: Diagnosis present

## 2023-02-04 DIAGNOSIS — R109 Unspecified abdominal pain: Secondary | ICD-10-CM

## 2023-02-04 LAB — COMPREHENSIVE METABOLIC PANEL
ALT: 17 U/L (ref 0–44)
AST: 16 U/L (ref 15–41)
Albumin: 4 g/dL (ref 3.5–5.0)
Alkaline Phosphatase: 75 U/L (ref 38–126)
Anion gap: 10 (ref 5–15)
BUN: 9 mg/dL (ref 6–20)
CO2: 21 mmol/L — ABNORMAL LOW (ref 22–32)
Calcium: 8.7 mg/dL — ABNORMAL LOW (ref 8.9–10.3)
Chloride: 108 mmol/L (ref 98–111)
Creatinine, Ser: 0.64 mg/dL (ref 0.44–1.00)
GFR, Estimated: >60 mL/min (ref >60)
Glucose, Bld: 111 mg/dL — ABNORMAL HIGH (ref 70–99)
Potassium: 3.5 mmol/L (ref 3.5–5.1)
Sodium: 139 mmol/L (ref 135–145)
Total Bilirubin: 0.7 mg/dL (ref 0.3–1.2)
Total Protein: 7.5 g/dL (ref 6.5–8.1)

## 2023-02-04 LAB — CBC
HCT: 43.3 % (ref 36.0–46.0)
Hemoglobin: 14.6 g/dL (ref 12.0–15.0)
MCH: 31.1 pg (ref 26.0–34.0)
MCHC: 33.7 g/dL (ref 30.0–36.0)
MCV: 92.1 fL (ref 80.0–100.0)
Platelets: 381 10*3/uL (ref 150–400)
RBC: 4.7 MIL/uL (ref 3.87–5.11)
RDW: 12.4 % (ref 11.5–15.5)
WBC: 7.6 10*3/uL (ref 4.0–10.5)
nRBC: 0 % (ref 0.0–0.2)

## 2023-02-04 LAB — URINALYSIS, ROUTINE W REFLEX MICROSCOPIC
Bilirubin Urine: NEGATIVE
Glucose, UA: NEGATIVE mg/dL
Ketones, ur: NEGATIVE mg/dL
Leukocytes,Ua: NEGATIVE
Nitrite: NEGATIVE
Protein, ur: NEGATIVE mg/dL
Specific Gravity, Urine: 1.023 (ref 1.005–1.030)
pH: 6 (ref 5.0–8.0)

## 2023-02-04 LAB — LIPASE, BLOOD: Lipase: 38 U/L (ref 11–51)

## 2023-02-04 LAB — POC URINE PREG, ED: Preg Test, Ur: NEGATIVE

## 2023-02-04 MED ORDER — ALUM & MAG HYDROXIDE-SIMETH 200-200-20 MG/5ML PO SUSP
15.0000 mL | Freq: Once | ORAL | Status: AC
  Administered 2023-02-04: 15 mL via ORAL
  Filled 2023-02-04: qty 30

## 2023-02-04 MED ORDER — ONDANSETRON 4 MG PO TBDP: 4.0000 mg | ORAL_TABLET | Freq: Four times a day (QID) | ORAL | 0 refills | Status: DC | PRN

## 2023-02-04 MED ORDER — FAMOTIDINE 20 MG PO TABS
20.0000 mg | ORAL_TABLET | Freq: Once | ORAL | Status: AC
  Administered 2023-02-04: 20 mg via ORAL
  Filled 2023-02-04: qty 1

## 2023-02-04 MED ORDER — ONDANSETRON 4 MG PO TBDP
4.0000 mg | ORAL_TABLET | Freq: Once | ORAL | Status: AC
  Administered 2023-02-04: 4 mg via ORAL
  Filled 2023-02-04: qty 1

## 2023-02-04 MED ORDER — IOHEXOL 300 MG/ML  SOLN
100.0000 mL | Freq: Once | INTRAMUSCULAR | Status: AC | PRN
  Administered 2023-02-04: 100 mL via INTRAVENOUS

## 2023-02-04 NOTE — ED Triage Notes (Signed)
Pt via POV from home. Pt c/o generalized abd pain and swelling for the past couple of month. States that it has gotten worse for the past week. Pt c/o pain more after she eats. Pt does still has a gallbladder and appendix. Pt is A&Ox4 and NAD

## 2023-02-04 NOTE — ED Provider Notes (Signed)
[facility] Provider Note    Event Date/Time   First MD Initiated Contact with Patient 02/04/23 1010     (approximate)   History   Abdominal Pain   HPI {Remember to add pertinent medical, surgical, social, and/or OB history to HPI:1} Becky Dunlap is a 37 y.o. female on review of hospital charts has a history of spontaneous vaginal delivery in 2013 as well as anxiety     Physical Exam   Triage Vital Signs: ED Triage Vitals  Encounter Vitals Group     BP 02/04/23 0948 (!) 140/101     Systolic BP Percentile --      Diastolic BP Percentile --      Pulse Rate 02/04/23 0948 (!) 102     Resp 02/04/23 0948 18     Temp 02/04/23 0948 98.7 F (37.1 C)     Temp Source 02/04/23 0948 Oral     SpO2 02/04/23 0948 97 %     Weight 02/04/23 0947 222 lb (100.7 kg)     Height 02/04/23 0947 5\' 6"  (1.676 m)     Head Circumference --      Peak Flow --      Pain Score 02/04/23 0947 8     Pain Loc --      Pain Education --      Exclude from Growth Chart --     Most recent vital signs: Vitals:   02/04/23 0948  BP: (!) 140/101  Pulse: (!) 102  Resp: 18  Temp: 98.7 F (37.1 C)  SpO2: 97%    {Only need to document appropriate and relevant physical exam:1} General: Awake, no distress. *** CV:  Good peripheral perfusion. *** Resp:  Normal effort. *** Abd:  No distention. *** Other:  ***   ED Results / Procedures / Treatments   Labs (all labs ordered are listed, but only abnormal results are displayed) Labs Reviewed  LIPASE, BLOOD  COMPREHENSIVE METABOLIC PANEL  CBC  URINALYSIS, ROUTINE W REFLEX MICROSCOPIC  POC URINE PREG, ED     EKG  ***   RADIOLOGY *** {USE THE WORD "INTERPRETED"!! You MUST document your own interpretation of imaging, as well as the fact that you reviewed the radiologist's report!:1}   PROCEDURES:  Critical Care performed: {CriticalCareYesNo:19197::"Yes, see critical care procedure  note(s)","No"}  Procedures   MEDICATIONS ORDERED IN ED: Medications - No data to display   IMPRESSION / MDM / ASSESSMENT AND PLAN / ED COURSE  I reviewed the triage vital signs and the nursing notes.                              Differential diagnosis includes, but is not limited to, ***  Patient's presentation is most consistent with {EM COPA:27473}  *** {If the patient is on the monitor, remove the brackets and asterisks on the sentence below and remember to document it as a Procedure as well. Otherwise delete the sentence below:1} {**The patient is on the cardiac monitor to evaluate for evidence of arrhythmia and/or significant heart rate changes.**} {Remember to include, when applicable, any/all of the following data: independent review of imaging independent review of labs (comment specifically on pertinent positives and negatives) review of specific prior hospitalizations, PCP/specialist notes, etc. discuss meds given and prescribed document any discussion with consultants (including hospitalists) any clinical decision tools you used and why (PECARN, NEXUS, etc.) did you consider admitting the patient? document social determinants  of health affecting patient's care (homelessness, inability to follow up in a timely fashion, etc) document any pre-existing conditions increasing risk on current visit (e.g. diabetes and HTN increasing danger of high-risk chest pain/ACS) describes what meds you gave (especially parenteral) and why any other interventions?:1}     FINAL CLINICAL IMPRESSION(S) / ED DIAGNOSES   Final diagnoses:  None     Rx / DC Orders   ED Discharge Orders     None        Note:  This document was prepared using Dragon voice recognition software and may include unintentional dictation errors.

## 2023-02-09 ENCOUNTER — Ambulatory Visit (INDEPENDENT_AMBULATORY_CARE_PROVIDER_SITE_OTHER): Payer: Managed Care, Other (non HMO) | Admitting: Surgery

## 2023-02-09 ENCOUNTER — Encounter: Payer: Self-pay | Admitting: Surgery

## 2023-02-09 VITALS — BP 125/89 | HR 89 | Temp 98.4°F | Ht 66.0 in | Wt 226.2 lb

## 2023-02-09 DIAGNOSIS — K449 Diaphragmatic hernia without obstruction or gangrene: Secondary | ICD-10-CM | POA: Diagnosis not present

## 2023-02-09 DIAGNOSIS — K219 Gastro-esophageal reflux disease without esophagitis: Secondary | ICD-10-CM | POA: Diagnosis not present

## 2023-02-09 DIAGNOSIS — R1310 Dysphagia, unspecified: Secondary | ICD-10-CM

## 2023-02-09 NOTE — Patient Instructions (Addendum)
Your Barium Swallow is scheduled for 02/13/2023 9:30 am (arrive by 9 am) at [facility]. Nothing to eat or drink 3 hours prior.   Centralized Scheduling [phone].  If you have any concerns or questions, please feel free to call our office.   Hiatal Hernia  A hiatal hernia occurs when part of the stomach slides above the muscle that separates the abdomen from the chest (diaphragm). A person can be born with a hiatal hernia (congenital), or it may develop over time. In almost all cases of hiatal hernia, only the top part of the stomach pushes through the diaphragm. Many people have a hiatal hernia with no symptoms. The larger the hernia, the more likely it is that you will have symptoms. In some cases, a hiatal hernia allows stomach acid to flow back into the tube that carries food from your mouth to your stomach (esophagus). This may cause heartburn symptoms. The development of heartburn symptoms may mean that you have a condition called gastroesophageal reflux disease (GERD). What are the causes? This condition is caused by a weakness in the opening (hiatus) where the esophagus passes through the diaphragm to attach to the upper part of the stomach. A person may be born with a weakness in the hiatus, or a weakness can develop over time. What increases the risk? This condition is more likely to develop in: Older people. Age is a major risk factor for a hiatal hernia, especially if you are over the age of 37. Pregnant women. People who are overweight. People who have frequent constipation. What are the signs or symptoms? Symptoms of this condition usually develop in the form of GERD symptoms. Symptoms include: Heartburn. Upset stomach (indigestion). Trouble swallowing. Coughing or wheezing. Wheezing is making high-pitched whistling sounds when you breathe. Sore throat. Chest pain. Nausea and vomiting. How is this diagnosed? This condition may be diagnosed during testing for GERD.  Tests that may be done include: X-rays of your stomach or chest. An upper gastrointestinal (GI) series. This is an X-ray exam of your GI tract that is taken after you swallow a chalky liquid that shows up clearly on the X-ray. Endoscopy. This is a procedure to look into your stomach using a thin, flexible tube that has a tiny camera and light on the end of it. How is this treated? This condition may be treated by: Dietary and lifestyle changes to help reduce GERD symptoms. Medicines. These may include: Over-the-counter antacids. Medicines that make your stomach empty more quickly. Medicines that block the production of stomach acid (H2 blockers). Stronger medicines to reduce stomach acid (proton pump inhibitors). Surgery to repair the hernia, if other treatments are not helping. If you have no symptoms, you may not need treatment. Follow these instructions at home: Lifestyle and activity Do not use any products that contain nicotine or tobacco. These products include cigarettes, chewing tobacco, and vaping devices, such as e-cigarettes. If you need help quitting, ask your health care provider. Try to achieve and maintain a healthy body weight. Avoid putting pressure on your abdomen. Anything that puts pressure on your abdomen increases the amount of acid that may be pushed up into your esophagus. Avoid bending over, especially after eating. Raise the head of your bed by putting blocks under the legs. This keeps your head and esophagus higher than your stomach. Do not wear tight clothing around your chest or stomach. Try not to strain when having a bowel movement, when urinating, or when lifting heavy objects. Eating and  drinking Avoid foods that can worsen GERD symptoms. These may include: Fatty foods, like fried foods. Citrus fruits, like oranges or lemon. Other foods and drinks that contain acid, like orange juice or tomatoes. Spicy food. Chocolate. Eat frequent small meals instead of  three large meals a day. This helps prevent your stomach from getting too full. Eat slowly. Do not lie down right after eating. Do not eat 1-2 hours before bed. Do not drink beverages with caffeine. These include cola, coffee, cocoa, and tea. Do not drink alcohol. General instructions Take over-the-counter and prescription medicines only as told by your health care provider. Keep all follow-up visits. Your health care provider will want to check that any new prescribed medicines are helping your symptoms. Contact a health care provider if: Your symptoms are not controlled with medicines or lifestyle changes. You are having trouble swallowing. You have coughing or wheezing that will not go away. Your pain is getting worse. Your pain spreads to your arms, neck, jaw, teeth, or back. You feel nauseous or you vomit. Get help right away if: You have shortness of breath. You vomit blood. You have bright red blood in your stools. You have black, tarry stools. These symptoms may be an emergency. Get help right away. Call 911. Do not wait to see if the symptoms will go away. Do not drive yourself to the hospital. Summary A hiatal hernia occurs when part of the stomach slides above the muscle that separates the abdomen from the chest. A person may be born with a weakness in the hiatus, or a weakness can develop over time. Symptoms of a hiatal hernia may include heartburn, trouble swallowing, or sore throat. Management of a hiatal hernia includes eating frequent small meals instead of three large meals a day. Get help right away if you vomit blood, have bright red blood in your stools, or have black, tarry stools. This information is not intended to replace advice given to you by your health care provider. Make sure you discuss any questions you have with your health care provider. Document Revised: 08/13/2021 Document Reviewed: 08/13/2021 Elsevier Patient Education  2024 [employer].

## 2023-02-13 ENCOUNTER — Other Ambulatory Visit: Payer: Managed Care, Other (non HMO)

## 2023-03-31 ENCOUNTER — Ambulatory Visit: Payer: Managed Care, Other (non HMO) | Admitting: Physician Assistant

## 2023-03-31 NOTE — Progress Notes (Deleted)
Becky Amy, PA-C [address]  [address]  [place], [place] [address]  Main: [phone]  Fax: [phone]   Gastroenterology Consultation  Referring Provider:     Sharyn Creamer, MD Primary Care Physician:  Becky Spearing, FNP Primary Gastroenterologist:  *** Reason for Consultation:     Upper abdominal pain, nausea, vomiting        HPI:   Becky Dunlap is a 37 y.o. y/o female referred for consultation & management  by Becky Spearing, FNP.    Went to [facility] ED 02/04/2023 to evaluate abdominal pain.  Has episodes of nausea with occasional vomiting after eating.  Admits to upper abdominal pain and occasional acid reflux.  She was given Zofran and told to follow-up with GI.  RUQ ultrasound 02/04/2023 was normal with no gallstones.  Normal CBD 2 mm.  Normal liver.  CT abdomen pelvis with contrast 02/04/2023 showed moderate hiatal hernia, otherwise no acute abnormality.  Incidental diverticulosis with no diverticulitis.  Labs 02/04/2023 showed glucose 111, otherwise normal CMP.  Lipase and LFTs normal.  CBC normal with hemoglobin 14.6.  Negative pregnancy test.  UA negative.  Past Medical History:  Diagnosis Date   Anxiety    on celexa   SVD (spontaneous vaginal delivery) 11/07/2011    Past Surgical History:  Procedure Laterality Date   DILATION AND CURETTAGE OF UTERUS  2009    Prior to Admission medications   Medication Sig Start Date End Date Taking? Authorizing Provider  albuterol (PROVENTIL HFA;VENTOLIN HFA) 108 (90 Base) MCG/ACT inhaler Inhale 2 puffs into the lungs every 6 (six) hours as needed for wheezing or shortness of breath (cough). 07/26/17   Becky Schatz, PA-C  montelukast (SINGULAIR) 10 MG tablet Take 1 tablet (10 mg total) by mouth at bedtime. 07/26/17   Becky Schatz, PA-C  ondansetron (ZOFRAN-ODT) 4 MG disintegrating tablet Take 1 tablet (4 mg total) by mouth every 6 (six) hours as needed for nausea or vomiting. 02/04/23   Becky Creamer, MD   SPRINTEC 28 0.25-35 MG-MCG tablet  01/07/19   [provider]  cimetidine (CIMETIDINE 200) 200 MG tablet Take 200 mg by mouth 4 (four) times daily.  04/29/19  [provider]    Family History  Problem Relation Age of Onset   Cancer Mother    Anesthesia problems Neg Hx      Social History   Tobacco Use   Smoking status: Every Day    Types: Cigarettes   Smokeless tobacco: Never  Vaping Use   Vaping status: Never Used  Substance Use Topics   Alcohol use: Not Currently   Drug use: No    Allergies as of 03/31/2023   (No Known Allergies)    Review of Systems:    All systems reviewed and negative except where noted in HPI.   Physical Exam:  There were no vitals taken for this visit. No LMP recorded.  General:   Alert,  Well-developed, well-nourished, pleasant and cooperative in NAD Lungs:  Respirations even and unlabored.  Clear throughout to auscultation.   No wheezes, crackles, or rhonchi. No acute distress. Heart:  Regular rate and rhythm; no murmurs, clicks, rubs, or gallops. Abdomen:  Normal bowel sounds.  No bruits.  Soft, and non-distended without masses, hepatosplenomegaly or hernias noted.  No Tenderness.  No guarding or rebound tenderness.    Neurologic:  Alert and oriented x3;  grossly normal neurologically. Psych:  Alert and cooperative. Normal mood and affect.  Imaging  Studies: No results found.  Assessment and Plan:   Becky Dunlap is a 37 y.o. y/o female has been referred for:   1.  Moderate hiatal hernia 2.  Upper abdominal pain 3.  Nausea/vomiting 4.  GERD  *Recent RUQ ultrasound, labs, and abdominal pelvic CT were normal in the ED.  Plan: H. pylori breath test EGD  PPI/H2 RB  GERD Diet  Follow up ***  Becky Amy, PA-C    BP check ***

## 2023-11-14 ENCOUNTER — Encounter: Payer: Self-pay | Admitting: Emergency Medicine

## 2023-11-14 ENCOUNTER — Ambulatory Visit
Admission: EM | Admit: 2023-11-14 | Discharge: 2023-11-14 | Disposition: A | Discharge status: Home / Self Care | Attending: Physician Assistant | Admitting: Physician Assistant

## 2023-11-14 ENCOUNTER — Ambulatory Visit (INDEPENDENT_AMBULATORY_CARE_PROVIDER_SITE_OTHER)

## 2023-11-14 DIAGNOSIS — J209 Acute bronchitis, unspecified: Secondary | ICD-10-CM

## 2023-11-14 DIAGNOSIS — J45901 Unspecified asthma with (acute) exacerbation: Secondary | ICD-10-CM | POA: Diagnosis not present

## 2023-11-14 DIAGNOSIS — R051 Acute cough: Secondary | ICD-10-CM

## 2023-11-14 MED ORDER — PREDNISONE 20 MG PO TABS: 40.0000 mg | ORAL_TABLET | Freq: Every day | ORAL | 0 refills | Status: AC

## 2023-11-14 MED ORDER — GUAIFENESIN-CODEINE 100-10 MG/5ML PO SOLN: ORAL | 0 refills | Status: DC

## 2023-11-14 MED ORDER — ALBUTEROL SULFATE HFA 108 (90 BASE) MCG/ACT IN AERS: 1.0000 | INHALATION_SPRAY | Freq: Four times a day (QID) | RESPIRATORY_TRACT | 0 refills | Status: DC | PRN

## 2023-11-14 MED ORDER — IPRATROPIUM-ALBUTEROL 0.5-2.5 (3) MG/3ML IN SOLN: 3.0000 mL | Freq: Four times a day (QID) | RESPIRATORY_TRACT | 0 refills | Status: DC | PRN

## 2023-11-14 NOTE — ED Provider Notes (Signed)
 [facility]    CSN: [id number] Arrival date & time: 11/14/23  1008      History   Chief Complaint Chief Complaint  Patient presents with   Cough    HPI Becky Dunlap is a 38 y.o. female presenting for cough and congestion as well as body aches and chills for the past 5 days.  She also reports being very fatigued and having a mild sore throat and wheezing/SOB. Has been taking over-the-counter cough medication but says it is not effective and she cannot sleep at night.  Has been using duoneb at home. 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 Patient is a smoker. No other complaints.  HPI  Past Medical History:  Diagnosis Date   Anxiety    on celexa   SVD (spontaneous vaginal delivery) 11/07/2011    Patient Active Problem List   Diagnosis Date Noted   SVD (spontaneous vaginal delivery) 11/07/2011    Past Surgical History:  Procedure Laterality Date   DILATION AND CURETTAGE OF UTERUS  2009    OB History     Gravida  6   Para  1   Term  1   Preterm  0   AB  1   Living  1      SAB  1   IAB  0   Ectopic  0   Multiple  0   Live Births  1            Home Medications    Prior to Admission medications   Medication Sig Start Date End Date Taking? Authorizing Provider  albuterol  (VENTOLIN  HFA) 108 (90 Base) MCG/ACT inhaler Inhale 1-2 puffs into the lungs every 6 (six) hours as needed for wheezing or shortness of breath. 11/14/23  Yes Nancy Axon B, PA-C  guaiFENesin -codeine  100-10 MG/5ML syrup Take 5-10 ml po q6h prn cough 11/14/23  Yes Floydene Hy, PA-C  ipratropium-albuterol  (DUONEB) 0.5-2.5 (3) MG/3ML SOLN Take 3 mLs by nebulization every 6 (six) hours as needed (wheezing, sob). 11/14/23  Yes Floydene Hy, PA-C  predniSONE  (DELTASONE ) 20 MG tablet Take 2 tablets (40 mg total) by mouth daily for 5 days. 11/14/23 11/19/23 Yes Floydene Hy, PA-C  albuterol   (PROVENTIL  HFA;VENTOLIN  HFA) 108 (90 Base) MCG/ACT inhaler Inhale 2 puffs into the lungs every 6 (six) hours as needed for wheezing or shortness of breath (cough). 07/26/17   Sharin David, PA-C  montelukast  (SINGULAIR ) 10 MG tablet Take 1 tablet (10 mg total) by mouth at bedtime. 07/26/17   Sharin David, PA-C  ondansetron  (ZOFRAN -ODT) 4 MG disintegrating tablet Take 1 tablet (4 mg total) by mouth every 6 (six) hours as needed for nausea or vomiting. 02/04/23   Iver Marker, MD  SPRINTEC 28 0.25-35 MG-MCG tablet  01/07/19   [provider]  cimetidine (CIMETIDINE 200) 200 MG tablet Take 200 mg by mouth 4 (four) times daily.  04/29/19  [provider]    Family History Family History  Problem Relation Age of Onset   Cancer Mother    Anesthesia problems Neg Hx     Social History Social History   Tobacco Use   Smoking status: Every Day    Types: Cigarettes   Smokeless tobacco: Never  Vaping Use   Vaping status: Never Used  Substance Use Topics   Alcohol use: Not Currently   Drug use: No  Allergies   Patient has no known allergies.   Review of Systems Review of Systems  Constitutional:  Positive for chills and fatigue. Negative for diaphoresis and fever.  HENT:  Positive for congestion, rhinorrhea and sore throat. Negative for ear pain, sinus pressure and sinus pain.   Respiratory:  Positive for cough, shortness of breath and wheezing.   Cardiovascular:  Negative for chest pain.  Gastrointestinal:  Negative for abdominal pain, nausea and vomiting.  Musculoskeletal:  Positive for myalgias. Negative for arthralgias.  Skin:  Negative for rash.  Neurological:  Positive for headaches. Negative for weakness.  Hematological:  Negative for adenopathy.     Physical Exam Triage Vital Signs ED Triage Vitals  Enc Vitals Group     BP      Pulse      Resp      Temp      Temp src      SpO2      Weight      Height      Head Circumference      Peak Flow       Pain Score      Pain Loc      Pain Edu?      Excl. in GC?    No data found.  Updated Vital Signs BP 109/74 (BP Location: Right Arm)   Pulse (!) 56   Temp 98.8 F (37.1 C) (Oral)   Resp 15   Ht 5\' 6"  (1.676 m)   Wt 222 lb (100.7 kg)   LMP 11/06/2023 (Approximate)   SpO2 98%   BMI 35.83 kg/m     Physical Exam Vitals and nursing note reviewed.  Constitutional:      General: She is not in acute distress.    Appearance: Normal appearance. She is ill-appearing. She is not toxic-appearing.  HENT:     Head: Normocephalic and atraumatic.     Nose: Congestion present.     Mouth/Throat:     Mouth: Mucous membranes are moist.     Pharynx: Oropharynx is clear. Posterior oropharyngeal erythema present.  Eyes:     General: No scleral icterus.       Right eye: No discharge.        Left eye: No discharge.     Conjunctiva/sclera: Conjunctivae normal.  Cardiovascular:     Rate and Rhythm: Normal rate and regular rhythm.     Heart sounds: Normal heart sounds.  Pulmonary:     Effort: Pulmonary effort is normal. No respiratory distress.     Breath sounds: Wheezing present.  Musculoskeletal:     Cervical back: Neck supple.  Skin:    General: Skin is dry.  Neurological:     General: No focal deficit present.     Mental Status: She is alert. Mental status is at baseline.     Motor: No weakness.     Gait: Gait normal.  Psychiatric:        Mood and Affect: Mood normal.        Behavior: Behavior normal.      UC Treatments / Results  Labs (all labs ordered are listed, but only abnormal results are displayed) Labs Reviewed - No data to display   EKG   Radiology DG Chest 2 View Result Date: 11/14/2023 CLINICAL DATA:  38 year old female with cough and congestion for 5 days. EXAM: CHEST - 2 VIEW COMPARISON:  Chest radiographs 08/31/2017 and earlier. FINDINGS: PA and lateral views. Lower lung volumes on the PA,  more normal on the lateral. Crowding of basilar lung markings on  the PA, on the lateral no confluent lung opacity. No pneumothorax, pulmonary edema or pleural effusion. Negative visible bowel gas. No acute osseous abnormality identified. IMPRESSION: Low lung volumes on the PA view.  No cardiopulmonary abnormality. Electronically Signed   By: Marlise Simpers M.D.   On: 11/14/2023 10:49    Procedures Procedures (including critical care time)  Medications Ordered in UC Medications - No data to display  Initial Impression / Assessment and Plan / UC Course  I have reviewed the triage vital signs and the nursing notes.  Pertinent labs & imaging results that were available during my care of the patient were reviewed by me and considered in my medical decision making (see chart for details).   38 year old female presenting for cough, congestion, fatigue, aches over 5 days.  Over the past 3 days the congestion has gotten worse and she has SOB and wheezing.  Patient has been taking over-the-counter cough medicine without relief. Also using duoneb and will need refill.  Vitals normal stable patient is nontoxic-appearing.  She does appear ill.  Exam shows nasal congestion and erythema posterior pharynx. Scattered wheezes.  COVID negative.   CXR obtained to rule out pneumonia. Negative   Viral bronchitis and asthma exacerbation. Supportive care.  Sent Cheratussin to pharmacy for cough for her to try at night.  Increase rest and fluids.  Refilled albuterol  inhaler and neb.  Sent prednisone ., Reviewed returning if fever or she has worsening cough or breathing difficulty.    Final Clinical Impressions(s) / UC Diagnoses   Final diagnoses:  Acute cough  Acute bronchitis, unspecified organism  Asthma with acute exacerbation, unspecified asthma severity, unspecified whether persistent     Discharge Instructions      - You have a chest cold and asthma flareup.  I sent cough medicine, refilled your albuterol /ipratropium nebulizer solution, refilled albuterol  inhaler and  also sent corticosteroids. -Cough and congestion related to bronchitis can last for a few weeks. - Return if you have a fever or increased breathing problem.    ED Prescriptions     Medication Sig Dispense Auth. Provider   guaiFENesin -codeine  100-10 MG/5ML syrup Take 5-10 ml po q6h prn cough 118 mL Nancy Axon B, PA-C   predniSONE  (DELTASONE ) 20 MG tablet Take 2 tablets (40 mg total) by mouth daily for 5 days. 10 tablet Nancy Axon B, PA-C   albuterol  (VENTOLIN  HFA) 108 (90 Base) MCG/ACT inhaler Inhale 1-2 puffs into the lungs every 6 (six) hours as needed for wheezing or shortness of breath. 1 each Floydene Hy, PA-C   ipratropium-albuterol  (DUONEB) 0.5-2.5 (3) MG/3ML SOLN Take 3 mLs by nebulization every 6 (six) hours as needed (wheezing, sob). 360 mL Floydene Hy, PA-C      I have reviewed the PDMP during this encounter.      Floydene Hy, PA-C 11/14/23 1142

## 2023-11-14 NOTE — ED Triage Notes (Signed)
 Patient c/o cough and chest congestion that started on Monday.  Patient states that she tested herself at her work for strep, covid and flu and all were negative on Wed.  Patient unsure of fevers. Patient wants to be tested for Pneumonia.

## 2023-11-14 NOTE — Discharge Instructions (Addendum)
-   You have a chest cold and asthma flareup.  I sent cough medicine, refilled your albuterol /ipratropium nebulizer solution, refilled albuterol  inhaler and also sent corticosteroids. -Cough and congestion related to bronchitis can last for a few weeks. - Return if you have a fever or increased breathing problem.

## 2023-12-07 ENCOUNTER — Ambulatory Visit
Admission: RE | Admit: 2023-12-07 | Discharge: 2023-12-07 | Source: Ambulatory Visit | Attending: Nurse Practitioner | Admitting: Nurse Practitioner

## 2023-12-07 VITALS — BP 112/79 | HR 93 | Temp 98.6°F | Resp 18 | Ht 66.0 in | Wt 228.0 lb

## 2023-12-07 DIAGNOSIS — R1011 Right upper quadrant pain: Secondary | ICD-10-CM | POA: Diagnosis not present

## 2023-12-07 NOTE — ED Provider Notes (Signed)
 [facility]    CSN: [id number] Arrival date & time: 12/07/23  1745      History   Chief Complaint Chief Complaint  Patient presents with   Abdominal Pain    Right upper abdominal pain - Entered by patient    HPI Becky Dunlap is a 38 y.o. female.   HPI  38 year old female with past medical history significant for anxiety presents for evaluation of right upper quadrant abdominal pain that is an ache and will occasionally increase to a sharp pain.  It is constant and she is currently rating it as a 7/10.  The pain is associated with nausea and does increase when she tries to eat food.  She denies any fever or vomiting.  Past Medical History:  Diagnosis Date   Anxiety    on celexa   SVD (spontaneous vaginal delivery) 11/07/2011    Patient Active Problem List   Diagnosis Date Noted   SVD (spontaneous vaginal delivery) 11/07/2011    Past Surgical History:  Procedure Laterality Date   DILATION AND CURETTAGE OF UTERUS  2009    OB History     Gravida  6   Para  1   Term  1   Preterm  0   AB  1   Living  1      SAB  1   IAB  0   Ectopic  0   Multiple  0   Live Births  1            Home Medications    Prior to Admission medications   Medication Sig Start Date End Date Taking? Authorizing Provider  albuterol  (PROVENTIL  HFA;VENTOLIN  HFA) 108 (90 Base) MCG/ACT inhaler Inhale 2 puffs into the lungs every 6 (six) hours as needed for wheezing or shortness of breath (cough). 07/26/17   Sharin David, PA-C  albuterol  (VENTOLIN  HFA) 108 (90 Base) MCG/ACT inhaler Inhale 1-2 puffs into the lungs every 6 (six) hours as needed for wheezing or shortness of breath. 11/14/23   Nancy Axon B, PA-C  guaiFENesin -codeine  100-10 MG/5ML syrup Take 5-10 ml po q6h prn cough 11/14/23   Floydene Hy, PA-C  ipratropium-albuterol  (DUONEB) 0.5-2.5 (3) MG/3ML SOLN Take 3 mLs by nebulization every 6 (six) hours as needed (wheezing, sob). 11/14/23   Floydene Hy, PA-C  montelukast  (SINGULAIR ) 10 MG tablet Take 1 tablet (10 mg total) by mouth at bedtime. 07/26/17   Sharin David, PA-C  ondansetron  (ZOFRAN -ODT) 4 MG disintegrating tablet Take 1 tablet (4 mg total) by mouth every 6 (six) hours as needed for nausea or vomiting. 02/04/23   Iver Marker, MD  SPRINTEC 28 0.25-35 MG-MCG tablet  01/07/19   [provider]  cimetidine (CIMETIDINE 200) 200 MG tablet Take 200 mg by mouth 4 (four) times daily.  04/29/19  [provider]    Family History Family History  Problem Relation Age of Onset   Cancer Mother    Anesthesia problems Neg Hx     Social History Social History   Tobacco Use   Smoking status: Former    Types: Cigarettes   Smokeless tobacco: Never  Vaping Use   Vaping status: Never Used  Substance Use Topics   Alcohol use: Not Currently   Drug use: No     Allergies   Patient has no known allergies.   Review of Systems Review of Systems  Constitutional:  Negative for fever.  Gastrointestinal:  Positive for abdominal pain and  nausea. Negative for vomiting.     Physical Exam Triage Vital Signs ED Triage Vitals  Encounter Vitals Group     BP 12/07/23 1809 112/79     Systolic BP Percentile --      Diastolic BP Percentile --      Pulse Rate 12/07/23 1809 93     Resp 12/07/23 1809 18     Temp 12/07/23 1809 98.6 F (37 C)     Temp Source 12/07/23 1809 Oral     SpO2 12/07/23 1809 98 %     Weight 12/07/23 1807 228 lb (103.4 kg)     Height 12/07/23 1807 5\' 6"  (1.676 m)     Head Circumference --      Peak Flow --      Pain Score 12/07/23 1807 7     Pain Loc --      Pain Education --      Exclude from Growth Chart --    No data found.  Updated Vital Signs BP 112/79 (BP Location: Left Arm)   Pulse 93   Temp 98.6 F (37 C) (Oral)   Resp 18   Ht 5\' 6"  (1.676 m)   Wt 228 lb (103.4 kg)   LMP 11/06/2023 (Approximate)   SpO2 98%   BMI 36.80 kg/m   Visual Acuity Right Eye Distance:   Left  Eye Distance:   Bilateral Distance:    Right Eye Near:   Left Eye Near:    Bilateral Near:     Physical Exam Vitals and nursing note reviewed.  Constitutional:      Appearance: Normal appearance. She is not ill-appearing.  HENT:     Head: Normocephalic and atraumatic.  Cardiovascular:     Rate and Rhythm: Normal rate and regular rhythm.     Pulses: Normal pulses.     Heart sounds: Normal heart sounds. No murmur heard.    No friction rub. No gallop.  Pulmonary:     Effort: Pulmonary effort is normal.     Breath sounds: Normal breath sounds. No wheezing, rhonchi or rales.  Abdominal:     Palpations: Abdomen is soft.     Tenderness: There is abdominal tenderness. There is no guarding or rebound.  Skin:    General: Skin is warm and dry.     Capillary Refill: Capillary refill takes less than 2 seconds.     Findings: No rash.  Neurological:     General: No focal deficit present.     Mental Status: She is alert and oriented to person, place, and time.      UC Treatments / Results  Labs (all labs ordered are listed, but only abnormal results are displayed) Labs Reviewed - No data to display  EKG   Radiology No results found.  Procedures Procedures (including critical care time)  Medications Ordered in UC Medications - No data to display  Initial Impression / Assessment and Plan / UC Course  I have reviewed the triage vital signs and the nursing notes.  Pertinent labs & imaging results that were available during my care of the patient were reviewed by me and considered in my medical decision making (see chart for details).   Patient is a pleasant 38 year old female presenting for evaluation of right upper quadrant abdominal pain as outlined HPI above.  The patient reports that her pain started 3 days ago and has been steadily increasing and becoming worse.  The pain does increase with deep breathing as well as movement  and palpation.  The pain also increases when she  eats.  She has been nauseous but she has not vomited.  She does indicate that she has had a bit of a decreased appetite as well.  On exam patient's abdomen is protuberant but soft.  She does have marked right upper quadrant tenderness.  Due to the degree of tenderness I am unable to determine if she has a positive Murphy sign.  Given the patient's cluster symptoms I am concerned it could possibly be her gallbladder.  I have recommended that she go to the emergency department to have her gallbladder evaluated.  She has elected to go to [facility].  She left ambulatory in stable condition.  Patient has been advised not to eat or drink anything until after she has been evaluated in the emergency room.   Final Clinical Impressions(s) / UC Diagnoses   Final diagnoses:  Right upper quadrant abdominal pain     Discharge Instructions      Please go to the emergency department at [facility] for evaluation of your gallbladder.  Do not eat or drink anything until after you have been evaluated in the ER.   ED Prescriptions   None    PDMP not reviewed this encounter.   Kent Pear, NP 12/07/23 1836

## 2023-12-07 NOTE — ED Triage Notes (Signed)
 Patient states RUQ pain and pain across upper abdomen since Friday.

## 2023-12-07 NOTE — Discharge Instructions (Addendum)
 Please go to the emergency department at [facility] for evaluation of your gallbladder.  Do not eat or drink anything until after you have been evaluated in the ER.

## 2024-01-27 ENCOUNTER — Ambulatory Visit
Admission: RE | Admit: 2024-01-27 | Discharge: 2024-01-27 | Disposition: A | Discharge status: Home / Self Care | Source: Ambulatory Visit

## 2024-01-27 VITALS — BP 121/79 | HR 101 | Temp 98.5°F | Resp 18

## 2024-01-27 DIAGNOSIS — K2100 Gastro-esophageal reflux disease with esophagitis, without bleeding: Secondary | ICD-10-CM | POA: Diagnosis not present

## 2024-01-27 MED ORDER — OMEPRAZOLE 40 MG PO CPDR
40.0000 mg | DELAYED_RELEASE_CAPSULE | Freq: Every day | ORAL | 2 refills | Status: AC
Start: 2024-01-27 — End: ?

## 2024-01-27 MED ORDER — ALUM & MAG HYDROXIDE-SIMETH 200-200-20 MG/5ML PO SUSP
30.0000 mL | Freq: Once | ORAL | Status: AC
  Administered 2024-01-27: 30 mL via ORAL

## 2024-01-27 MED ORDER — FAMOTIDINE 40 MG/5ML PO SUSR: 20.0000 mg | Freq: Two times a day (BID) | ORAL | 2 refills | Status: DC

## 2024-01-27 MED ORDER — LIDOCAINE VISCOUS HCL 2 % MT SOLN
15.0000 mL | Freq: Once | OROMUCOSAL | Status: AC
  Administered 2024-01-27: 15 mL via OROMUCOSAL

## 2024-01-27 NOTE — Discharge Instructions (Addendum)
 Follow the dietary guidelines to help better manage your GERD.  We are going to increase your omeprazole  to 40 mg daily.  I want you to start taking famotidine  20 mg twice daily to help better manage her GERD symptoms.  You need to call [facility] and schedule an appointment for evaluation to discuss your GERD symptoms and also treatment options for your hiatal hernia.

## 2024-01-27 NOTE — ED Triage Notes (Signed)
 Pt c/o heartburn for the past week. Pt states whenever she ingest anything it feels like its going to come back up and it burns in her chest. She is currently taking omeprazole  and its not helping.

## 2024-01-27 NOTE — ED Provider Notes (Signed)
 [facility]    CSN: [id number] Arrival date & time: 01/27/24  1528      History   Chief Complaint Chief Complaint  Patient presents with   Heartburn    HPI Becky Dunlap is a 38 y.o. female.   HPI  38 year old female with past medical history significant for anxiety and hiatal hernia presents for evaluation of increase in her reflux symptoms over the last week.  She reports that no matter what she eats she has a burning in her esophagus and she always feels bloated.  She also has early satiety.  She has had some nausea and vomiting.  Past Medical History:  Diagnosis Date   Anxiety    on celexa   SVD (spontaneous vaginal delivery) 11/07/2011    Patient Active Problem List   Diagnosis Date Noted   SVD (spontaneous vaginal delivery) 11/07/2011    Past Surgical History:  Procedure Laterality Date   DILATION AND CURETTAGE OF UTERUS  2009    OB History     Gravida  6   Para  1   Term  1   Preterm  0   AB  1   Living  1      SAB  1   IAB  0   Ectopic  0   Multiple  0   Live Births  1            Home Medications    Prior to Admission medications   Medication Sig Start Date End Date Taking? Authorizing Provider  famotidine  (PEPCID ) 40 MG/5ML suspension Take 2.5 mLs (20 mg total) by mouth 2 (two) times daily. 01/27/24  Yes Bernardino Ditch, NP  omeprazole  (PRILOSEC) 40 MG capsule Take 1 capsule (40 mg total) by mouth daily. 01/27/24  Yes Bernardino Ditch, NP  albuterol  (PROVENTIL  HFA;VENTOLIN  HFA) 108 (90 Base) MCG/ACT inhaler Inhale 2 puffs into the lungs every 6 (six) hours as needed for wheezing or shortness of breath (cough). 07/26/17   Arloa Ozell BIRCH, PA-C  albuterol  (VENTOLIN  HFA) 108 (90 Base) MCG/ACT inhaler Inhale 1-2 puffs into the lungs every 6 (six) hours as needed for wheezing or shortness of breath. 11/14/23   Arvis Jolan NOVAK, PA-C  montelukast  (SINGULAIR ) 10 MG tablet Take 1 tablet (10 mg total) by mouth at bedtime.  07/26/17   Harris, Michael D, PA-C  cimetidine (CIMETIDINE 200) 200 MG tablet Take 200 mg by mouth 4 (four) times daily.  04/29/19  [provider]    Family History Family History  Problem Relation Age of Onset   Cancer Mother    Anesthesia problems Neg Hx     Social History Social History   Tobacco Use   Smoking status: Former    Types: Cigarettes   Smokeless tobacco: Never  Vaping Use   Vaping status: Never Used  Substance Use Topics   Alcohol use: Not Currently   Drug use: No     Allergies   Patient has no known allergies.   Review of Systems Review of Systems  Gastrointestinal:  Positive for abdominal pain, nausea and vomiting.     Physical Exam Triage Vital Signs ED Triage Vitals  Encounter Vitals Group     BP      Girls Systolic BP Percentile      Girls Diastolic BP Percentile      Boys Systolic BP Percentile      Boys Diastolic BP Percentile      Pulse  Resp      Temp      Temp src      SpO2      Weight      Height      Head Circumference      Peak Flow      Pain Score      Pain Loc      Pain Education      Exclude from Growth Chart    No data found.  Updated Vital Signs BP 121/79 (BP Location: Right Arm)   Pulse (!) 101   Temp 98.5 F (36.9 C) (Oral)   Resp 18   LMP 01/07/2024 (Approximate)   SpO2 95%   Visual Acuity Right Eye Distance:   Left Eye Distance:   Bilateral Distance:    Right Eye Near:   Left Eye Near:    Bilateral Near:     Physical Exam Vitals and nursing note reviewed.  Constitutional:      Appearance: Normal appearance. She is not ill-appearing.  HENT:     Head: Normocephalic and atraumatic.  Abdominal:     Palpations: Abdomen is soft.     Tenderness: There is abdominal tenderness. There is no guarding or rebound.     Comments: Mild epigastric tenderness without guarding.  Skin:    General: Skin is warm and dry.     Capillary Refill: Capillary refill takes less than 2 seconds.   Neurological:     General: No focal deficit present.     Mental Status: She is alert and oriented to person, place, and time.      UC Treatments / Results  Labs (all labs ordered are listed, but only abnormal results are displayed) Labs Reviewed - No data to display  EKG   Radiology No results found.  Procedures Procedures (including critical care time)  Medications Ordered in UC Medications  alum & mag hydroxide-simeth (MAALOX/MYLANTA) 200-200-20 MG/5ML suspension 30 mL (30 mLs Oral Given 01/27/24 1555)  lidocaine  (XYLOCAINE ) 2 % viscous mouth solution 15 mL (15 mLs Mouth/Throat Given 01/27/24 1555)    Initial Impression / Assessment and Plan / UC Course  I have reviewed the triage vital signs and the nursing notes.  Pertinent labs & imaging results that were available during my care of the patient were reviewed by me and considered in my medical decision making (see chart for details).   Patient is a pleasant, nontoxic appearing 38 year old female presenting for evaluation of worsening GERD symptoms in the setting of having a hiatal hernia.  She has previously been evaluated by  GI and she was told that she needed to lose weight in order to better manage her hiatal hernia.  She reports that she has lost some weight but she does not feel like her GERD symptoms have improved.  She does eat some fast and fried food though she tries to keep it in moderation.  She is also endorsing early satiety.  She has been taking omeprazole  20 mg by mouth daily, and reports that she has been taking it more frequently due to the increase in symptoms.  She has supplemented this at times with Tums and Pepcid .  She does have mild tenderness in the epigastrium without guarding.  I suspect that it is her reflux, and possibly her hiatal hernia.  I will have staff administer GI cocktail and then I will discharge her home with an increase in her omeprazole  to 40 mg daily along with adding in  famotidine  20 mg  twice daily.  I have advised her to call and make an appointment with [facility] to discuss management of her symptoms.   Final Clinical Impressions(s) / UC Diagnoses   Final diagnoses:  Gastroesophageal reflux disease with esophagitis without hemorrhage     Discharge Instructions      Follow the dietary guidelines to help better manage your GERD.  We are going to increase your omeprazole  to 40 mg daily.  I want you to start taking famotidine  20 mg twice daily to help better manage her GERD symptoms.  You need to call [facility] and schedule an appointment for evaluation to discuss your GERD symptoms and also treatment options for your hiatal hernia.     ED Prescriptions     Medication Sig Dispense Auth. Provider   omeprazole  (PRILOSEC) 40 MG capsule Take 1 capsule (40 mg total) by mouth daily. 30 capsule Bernardino Ditch, NP   famotidine  (PEPCID ) 40 MG/5ML suspension Take 2.5 mLs (20 mg total) by mouth 2 (two) times daily. 150 mL Bernardino Ditch, NP      PDMP not reviewed this encounter.   Bernardino Ditch, NP 01/27/24 [phone]

## 2024-02-09 ENCOUNTER — Ambulatory Visit
Admission: RE | Admit: 2024-02-09 | Discharge: 2024-02-09 | Disposition: A | Discharge status: Home / Self Care | Payer: Self-pay | Source: Ambulatory Visit | Attending: Physician Assistant | Admitting: Physician Assistant

## 2024-02-09 VITALS — BP 136/96 | HR 98 | Temp 98.9°F | Resp 17 | Wt 215.0 lb

## 2024-02-09 DIAGNOSIS — Z3202 Encounter for pregnancy test, result negative: Secondary | ICD-10-CM | POA: Insufficient documentation

## 2024-02-09 DIAGNOSIS — R1030 Lower abdominal pain, unspecified: Secondary | ICD-10-CM | POA: Insufficient documentation

## 2024-02-09 DIAGNOSIS — N898 Other specified noninflammatory disorders of vagina: Secondary | ICD-10-CM | POA: Insufficient documentation

## 2024-02-09 DIAGNOSIS — B3731 Acute candidiasis of vulva and vagina: Secondary | ICD-10-CM | POA: Diagnosis present

## 2024-02-09 LAB — URINALYSIS, W/ REFLEX TO CULTURE (INFECTION SUSPECTED)
Bilirubin Urine: NEGATIVE
Glucose, UA: NEGATIVE mg/dL
Ketones, ur: NEGATIVE mg/dL
Leukocytes,Ua: NEGATIVE
Nitrite: NEGATIVE
Protein, ur: 100 mg/dL — AB
Specific Gravity, Urine: 1.02 (ref 1.005–1.030)
pH: >9 — ABNORMAL HIGH (ref 5.0–8.0)

## 2024-02-09 LAB — PREGNANCY, URINE: Preg Test, Ur: NEGATIVE

## 2024-02-09 MED ORDER — FLUCONAZOLE 150 MG PO TABS: ORAL_TABLET | ORAL | 0 refills | Status: DC

## 2024-02-09 NOTE — Discharge Instructions (Signed)
-   Pregnancy test was negative. - Urine not consistent with UTI. - Pending gonorrhea, chlamydia and trichomonas testing. - The urine did show some yeast so I sent medication to treat yeast infection.  We we will alter treatment based on the swab results if necessary. - Go upstairs to [facility] and have the HIV and syphilis testing performed. - Your cramping could be related to menstrual cramping.  I expect you should start a menstrual period soon.  You may take Tylenol  or Midol  and use warm compresses.  Increase rest and fluids.  Go to emergency department if acute worsening of the pain or associated fever.

## 2024-02-09 NOTE — ED Triage Notes (Addendum)
 Patient states that she wants to make sure she isnt preg. Patient states that she's a week late for her period. Patient is also having lower abdominal pressure and pressure when she goes to pee. . Patient also wants to be checked for STD. Swab and blood. Patient wants to be checked for a UTI, STD's and preg test. Orders put in at patients request

## 2024-02-09 NOTE — ED Provider Notes (Signed)
 [facility]    CSN: [id number] Arrival date & time: 02/09/24  0910      History   Chief Complaint Chief Complaint  Patient presents with   Possible Pregnancy    Entered by patient   SEXUALLY TRANSMITTED DISEASE    HPI Becky Dunlap is a 38 y.o. female presenting for lower abdominal pressure for the past couple of days. She has also slight vaginal discharge without odor. No fever, fatigue, dysuria, urgency, hematuria, vaginal discharge, flank pain, n/v/d, constipation. Patient says she recently found out that her partner might have been with someone else. He told her that he was recently treated for a UTI.  LMP 01/07/2024, 31 days ago. Patient says she is a few days late for her menstrual period.  Patient would like a pregnancy test and also requests STI testing including bloodwork, BV/yeast testing and a urinalysis.   HPI  Past Medical History:  Diagnosis Date   Anxiety    on celexa   SVD (spontaneous vaginal delivery) 11/07/2011    Patient Active Problem List   Diagnosis Date Noted   SVD (spontaneous vaginal delivery) 11/07/2011    Past Surgical History:  Procedure Laterality Date   DILATION AND CURETTAGE OF UTERUS  2009    OB History     Gravida  6   Para  1   Term  1   Preterm  0   AB  1   Living  1      SAB  1   IAB  0   Ectopic  0   Multiple  0   Live Births  1            Home Medications    Prior to Admission medications   Medication Sig Start Date End Date Taking? Authorizing Provider  fluconazole  (DIFLUCAN ) 150 MG tablet Take 1 tab po q72 hr prn yeast infection 02/09/24  Yes Arvis Jolan NOVAK, PA-C  albuterol  (PROVENTIL  HFA;VENTOLIN  HFA) 108 (90 Base) MCG/ACT inhaler Inhale 2 puffs into the lungs every 6 (six) hours as needed for wheezing or shortness of breath (cough). 07/26/17   Arloa Ozell BIRCH, PA-C  albuterol  (VENTOLIN  HFA) 108 (90 Base) MCG/ACT inhaler Inhale 1-2 puffs into the lungs every 6 (six) hours as  needed for wheezing or shortness of breath. 11/14/23   Arvis Jolan NOVAK, PA-C  famotidine  (PEPCID ) 40 MG/5ML suspension Take 2.5 mLs (20 mg total) by mouth 2 (two) times daily. 01/27/24   Bernardino Ditch, NP  montelukast  (SINGULAIR ) 10 MG tablet Take 1 tablet (10 mg total) by mouth at bedtime. 07/26/17   Arloa Ozell BIRCH, PA-C  omeprazole  (PRILOSEC) 40 MG capsule Take 1 capsule (40 mg total) by mouth daily. 01/27/24   Bernardino Ditch, NP  cimetidine (CIMETIDINE 200) 200 MG tablet Take 200 mg by mouth 4 (four) times daily.  04/29/19  [provider]    Family History Family History  Problem Relation Age of Onset   Cancer Mother    Anesthesia problems Neg Hx     Social History Social History   Tobacco Use   Smoking status: Former    Types: Cigarettes   Smokeless tobacco: Never  Vaping Use   Vaping status: Never Used  Substance Use Topics   Alcohol use: Not Currently   Drug use: No     Allergies   Patient has no known allergies.   Review of Systems Review of Systems  Constitutional:  Negative for appetite change, fatigue and fever.  Cardiovascular:  Negative for chest pain.  Gastrointestinal:  Positive for abdominal pain. Negative for constipation, diarrhea, nausea and vomiting.  Genitourinary:  Positive for menstrual problem and vaginal discharge. Negative for dysuria, flank pain, frequency, hematuria, urgency, vaginal bleeding and vaginal pain.  Musculoskeletal:  Negative for back pain.  Skin:  Negative for rash.     Physical Exam Triage Vital Signs ED Triage Vitals  Encounter Vitals Group     BP      Girls Systolic BP Percentile      Girls Diastolic BP Percentile      Boys Systolic BP Percentile      Boys Diastolic BP Percentile      Pulse      Resp      Temp      Temp src      SpO2      Weight      Height      Head Circumference      Peak Flow      Pain Score      Pain Loc      Pain Education      Exclude from Growth Chart    No data  found.  Updated Vital Signs BP (!) 136/96 (BP Location: Right Arm)   Pulse 98   Temp 98.9 F (37.2 C) (Oral)   Resp 17   Wt 215 lb (97.5 kg)   LMP 01/07/2024 (Approximate)   SpO2 95%   BMI 34.70 kg/m   Physical Exam Vitals and nursing note reviewed.  Constitutional:      General: She is not in acute distress.    Appearance: Normal appearance. She is not ill-appearing or toxic-appearing.  HENT:     Head: Normocephalic and atraumatic.  Eyes:     General: No scleral icterus.       Right eye: No discharge.        Left eye: No discharge.     Conjunctiva/sclera: Conjunctivae normal.  Cardiovascular:     Rate and Rhythm: Normal rate and regular rhythm.     Heart sounds: Normal heart sounds.  Pulmonary:     Effort: Pulmonary effort is normal. No respiratory distress.     Breath sounds: Normal breath sounds.  Abdominal:     Palpations: Abdomen is soft.     Tenderness: There is abdominal tenderness (generalized throughout lower abdomen). There is no right CVA tenderness, left CVA tenderness, guarding or rebound.  Musculoskeletal:     Cervical back: Neck supple.  Skin:    General: Skin is dry.  Neurological:     General: No focal deficit present.     Mental Status: She is alert. Mental status is at baseline.     Motor: No weakness.     Gait: Gait normal.  Psychiatric:        Mood and Affect: Mood normal.        Behavior: Behavior normal.      UC Treatments / Results  Labs (all labs ordered are listed, but only abnormal results are displayed) Labs Reviewed  URINALYSIS, W/ REFLEX TO CULTURE (INFECTION SUSPECTED) - Abnormal; Notable for the following components:      Result Value   APPearance HAZY (*)    pH >9.0 (*)    Hgb urine dipstick TRACE (*)    Protein, ur 100 (*)    Bacteria, UA FEW (*)    All other components within normal limits  PREGNANCY, URINE  CERVICOVAGINAL ANCILLARY ONLY    EKG  Radiology No results found.  Procedures Procedures (including  critical care time)  Medications Ordered in UC Medications - No data to display  Initial Impression / Assessment and Plan / UC Course  I have reviewed the triage vital signs and the nursing notes.  Pertinent labs & imaging results that were available during my care of the patient were reviewed by me and considered in my medical decision making (see chart for details).  Clinical Course as of 02/09/24 1032  Tue Feb 09, 2024  0932 Cervicovaginal ancillary only [LE]    Clinical Course User Index [LE] Arvis Jolan KATHEE DEVONNA   38 y/o female presents for lower abdominal pain for the past few days. Also reports vaginal discharge. Patient says her partner may have been unfaithful. Patient requests all of the following tests: swab for gonorrhea/chlamydia/trichomonas/BV/yeast, UA, HIV, RPR, and pregnancy test.   UA: hazy, +hgb, budding yeast. Not consistent with UTI.  Urine pregnancy: Negative.  Pending STI workup. Clinic and lab unable to collect HIV and RPR testing so I put in an order for [facility].  Sent diflucan  for yeast infection. Suspect menstrual cramping causing her pain. Will wait for results to return before treating patient for STIs. Discussed going to ER if fever or worsening pain.    Final Clinical Impressions(s) / UC Diagnoses   Final diagnoses:  Lower abdominal pain  Negative pregnancy test  Vaginal discharge  Vaginal yeast infection     Discharge Instructions      - Pregnancy test was negative. - Urine not consistent with UTI. - Pending gonorrhea, chlamydia and trichomonas testing. - The urine did show some yeast so I sent medication to treat yeast infection.  We we will alter treatment based on the swab results if necessary. - Go upstairs to [facility] and have the HIV and syphilis testing performed. - Your cramping could be related to menstrual cramping.  I expect you should start a menstrual period soon.  You may take Tylenol  or Midol  and use warm compresses.   Increase rest and fluids.  Go to emergency department if acute worsening of the pain or associated fever.     ED Prescriptions     Medication Sig Dispense Auth. Provider   fluconazole  (DIFLUCAN ) 150 MG tablet Take 1 tab po q72 hr prn yeast infection 2 tablet Arvis Jolan KATHEE, PA-C      PDMP not reviewed this encounter.   Arvis Jolan KATHEE, PA-C 02/09/24 1034

## 2024-02-10 ENCOUNTER — Ambulatory Visit (HOSPITAL_COMMUNITY): Payer: Self-pay

## 2024-02-10 LAB — CERVICOVAGINAL ANCILLARY ONLY
Bacterial Vaginitis (gardnerella): NEGATIVE
Candida Glabrata: NEGATIVE
Candida Vaginitis: POSITIVE — AB
Chlamydia: NEGATIVE
Comment: NEGATIVE
Comment: NEGATIVE
Comment: NEGATIVE
Comment: NEGATIVE
Comment: NEGATIVE
Comment: NORMAL
Neisseria Gonorrhea: NEGATIVE
Trichomonas: NEGATIVE

## 2024-03-09 ENCOUNTER — Ambulatory Visit
Admission: EM | Admit: 2024-03-09 | Discharge: 2024-03-09 | Disposition: A | Discharge status: Home / Self Care | Attending: Family Medicine | Admitting: Family Medicine

## 2024-03-09 DIAGNOSIS — J069 Acute upper respiratory infection, unspecified: Secondary | ICD-10-CM | POA: Insufficient documentation

## 2024-03-09 LAB — GROUP A STREP BY PCR: Group A Strep by PCR: NOT DETECTED

## 2024-03-09 LAB — RESP PANEL BY RT-PCR (FLU A&B, COVID) ARPGX2
Influenza A by PCR: NEGATIVE
Influenza B by PCR: NEGATIVE
SARS Coronavirus 2 by RT PCR: NEGATIVE

## 2024-03-09 MED ORDER — ALBUTEROL SULFATE HFA 108 (90 BASE) MCG/ACT IN AERS: 2.0000 | INHALATION_SPRAY | RESPIRATORY_TRACT | 0 refills | Status: AC | PRN

## 2024-03-09 MED ORDER — PROMETHAZINE-DM 6.25-15 MG/5ML PO SYRP: 5.0000 mL | ORAL_SOLUTION | Freq: Four times a day (QID) | ORAL | 0 refills | Status: DC | PRN

## 2024-03-09 NOTE — ED Triage Notes (Signed)
 Patient states that her sx started last night. Patient states that she tested today at work for covid, flu and strep.   Fatigue Sore throat Bodyaches Headache Nasal congestion Cough

## 2024-03-09 NOTE — Discharge Instructions (Addendum)
 Your strep, influenza and COVID are negative. You have a viral respiratory infection that will gradually improve over the next 7-10 days. Cough may last up to 3 weeks.    You can take Tylenol  and/or Ibuprofen  as needed for fever reduction and pain relief.    For cough: honey 1/2 to 1 teaspoon (you can dilute the honey in water or another fluid). Stop at the pharmacy to pick up your prescription cough medication. You can use a humidifier for chest congestion and cough.  If you don't have a humidifier, you can sit in the bathroom with the hot shower running.      For sore throat: try warm salt water gargles, Mucinex  sore throat cough drops or cepacol lozenges, throat spray, warm tea or water with lemon/honey, popsicles or ice, or OTC cold relief medicine for throat discomfort. You can also purchase chloraseptic spray at the pharmacy or dollar store.    For congestion: take a daily anti-histamine like Zyrtec, Claritin, and a oral decongestant, such as pseudoephedrine.  You can also use Flonase  1-2 sprays in each nostril daily. Afrin is also a good option, if you do not have high blood pressure.    It is important to stay hydrated: drink plenty of fluids (water, gatorade/powerade/pedialyte, juices, or teas) to keep your throat moisturized and help further relieve irritation/discomfort.    Return or go to the Emergency Department if symptoms worsen or do not improve in the next few days

## 2024-03-09 NOTE — ED Provider Notes (Signed)
 [facility]    CSN: [id number] Arrival date & time: 03/09/24  1311      History   Chief Complaint Chief Complaint  Patient presents with   Cough   Headache    HPI Becky Dunlap is a 38 y.o. female.   HPI  History obtained from the patient. Becky Dunlap presents for sore throat, cough, headache, nasal congestion that started last night.  Today, started having fatigue, chills and body aches.  She tested at work for [employer] but it was negative. In the past 2 weeks, they had 2 co-workers who had COVID.  When she felt similar in the past, she had COVID.    She had nausea and abdominal discomfort. No vomiting or diarrhea. No fever.  Took ibuprofen  this morning.    She has history of asthma but hasn't needed an inhaler in a while.     Past Medical History:  Diagnosis Date   Anxiety    on celexa   SVD (spontaneous vaginal delivery) 11/07/2011    Patient Active Problem List   Diagnosis Date Noted   SVD (spontaneous vaginal delivery) 11/07/2011    Past Surgical History:  Procedure Laterality Date   DILATION AND CURETTAGE OF UTERUS  2009    OB History     Gravida  6   Para  1   Term  1   Preterm  0   AB  1   Living  1      SAB  1   IAB  0   Ectopic  0   Multiple  0   Live Births  1            Home Medications    Prior to Admission medications   Medication Sig Start Date End Date Taking? Authorizing Provider  omeprazole  (PRILOSEC) 40 MG capsule Take 1 capsule (40 mg total) by mouth daily. 01/27/24  Yes Bernardino Ditch, NP  promethazine -dextromethorphan (PROMETHAZINE -DM) 6.25-15 MG/5ML syrup Take 5 mLs by mouth 4 (four) times daily as needed. 03/09/24  Yes Atziri Zubiate, DO  albuterol  (VENTOLIN  HFA) 108 (90 Base) MCG/ACT inhaler Inhale 2 puffs into the lungs every 4 (four) hours as needed for wheezing or shortness of breath (cough). 03/09/24   Kannen Moxey, DO  famotidine  (PEPCID ) 40 MG/5ML suspension Take 2.5 mLs (20 mg total)  by mouth 2 (two) times daily. 01/27/24   Bernardino Ditch, NP  fluconazole  (DIFLUCAN ) 150 MG tablet Take 1 tab po q72 hr prn yeast infection 02/09/24   Arvis Huxley B, PA-C  montelukast  (SINGULAIR ) 10 MG tablet Take 1 tablet (10 mg total) by mouth at bedtime. 07/26/17   Harris, Michael D, PA-C  cimetidine (CIMETIDINE 200) 200 MG tablet Take 200 mg by mouth 4 (four) times daily.  04/29/19  [provider]    Family History Family History  Problem Relation Age of Onset   Cancer Mother    Anesthesia problems Neg Hx     Social History Social History   Tobacco Use   Smoking status: Former    Types: Cigarettes   Smokeless tobacco: Never  Vaping Use   Vaping status: Never Used  Substance Use Topics   Alcohol use: Not Currently   Drug use: No     Allergies   Patient has no known allergies.   Review of Systems Review of Systems: negative unless otherwise stated in HPI.      Physical Exam Triage Vital Signs ED Triage Vitals  Encounter Vitals Group  BP 03/09/24 1422 136/80     Girls Systolic BP Percentile --      Girls Diastolic BP Percentile --      Boys Systolic BP Percentile --      Boys Diastolic BP Percentile --      Pulse Rate 03/09/24 1422 78     Resp 03/09/24 1422 18     Temp 03/09/24 1422 98.5 F (36.9 C)     Temp Source 03/09/24 1422 Oral     SpO2 03/09/24 1422 97 %     Weight 03/09/24 1421 215 lb (97.5 kg)     Height --      Head Circumference --      Peak Flow --      Pain Score 03/09/24 1421 7     Pain Loc --      Pain Education --      Exclude from Growth Chart --    No data found.  Updated Vital Signs BP 136/80 (BP Location: Right Arm)   Pulse 78   Temp 98.5 F (36.9 C) (Oral)   Resp 18   Wt 97.5 kg   LMP 02/09/2024 (Exact Date)   SpO2 97%   BMI 34.70 kg/m   Visual Acuity Right Eye Distance:   Left Eye Distance:   Bilateral Distance:    Right Eye Near:   Left Eye Near:    Bilateral Near:     Physical Exam GEN:     alert,  non-toxic appearing female in no distress    HENT:  mucus membranes moist, oropharyngeal without lesions or erythema, no tonsillar hypertrophy or exudates, clear nasal discharge EYES:   pupils equal and reactive, no scleral injection or discharge NECK:  normal ROM, +lymphadenopathy, no meningismus   RESP:  no increased work of breathing, clear to auscultation bilaterally CVS:   regular rate and rhythm Skin:   warm and dry, no rash on visible skin    UC Treatments / Results  Labs (all labs ordered are listed, but only abnormal results are displayed) Labs Reviewed  RESP PANEL BY RT-PCR (FLU A&B, COVID) [id number]  GROUP A STREP BY PCR    EKG   Radiology No results found.   Procedures Procedures (including critical care time)  Medications Ordered in UC Medications - No data to display  Initial Impression / Assessment and Plan / UC Course  I have reviewed the triage vital signs and the nursing notes.  Pertinent labs & imaging results that were available during my care of the patient were reviewed by me and considered in my medical decision making (see chart for details).       Pt is a 38 y.o. female who presents for 1 day of respiratory symptoms. Becky Dunlap is afebrile here without recent antipyretics. Satting well on room air. Overall pt is non-toxic appearing, well hydrated, without respiratory distress. Pulmonary exam is unremarkable.  COVID and influenza panel obtained and was negative. Strep PCR is negative. Suspect acute viral respiratory illness. Discussed symptomatic treatment.  Explained lack of efficacy of antibiotics in viral disease.  Typical duration of symptoms discussed. Promethazine  DM and albuterol  for cough and bronchospasm.  Work note provided.   Return and ED precautions given and voiced understanding. Discussed MDM, treatment plan and plan for follow-up with patient who agrees with plan.     Final Clinical Impressions(s) / UC Diagnoses   Final diagnoses:   Viral URI with cough     Discharge Instructions  Your strep, influenza and COVID are negative. You have a viral respiratory infection that will gradually improve over the next 7-10 days. Cough may last up to 3 weeks.    You can take Tylenol  and/or Ibuprofen  as needed for fever reduction and pain relief.    For cough: honey 1/2 to 1 teaspoon (you can dilute the honey in water or another fluid). Stop at the pharmacy to pick up your prescription cough medication. You can use a humidifier for chest congestion and cough.  If you don't have a humidifier, you can sit in the bathroom with the hot shower running.      For sore throat: try warm salt water gargles, Mucinex  sore throat cough drops or cepacol lozenges, throat spray, warm tea or water with lemon/honey, popsicles or ice, or OTC cold relief medicine for throat discomfort. You can also purchase chloraseptic spray at the pharmacy or dollar store.    For congestion: take a daily anti-histamine like Zyrtec, Claritin, and a oral decongestant, such as pseudoephedrine.  You can also use Flonase  1-2 sprays in each nostril daily. Afrin is also a good option, if you do not have high blood pressure.    It is important to stay hydrated: drink plenty of fluids (water, gatorade/powerade/pedialyte, juices, or teas) to keep your throat moisturized and help further relieve irritation/discomfort.    Return or go to the Emergency Department if symptoms worsen or do not improve in the next few days      ED Prescriptions     Medication Sig Dispense Auth. Provider   albuterol  (VENTOLIN  HFA) 108 (90 Base) MCG/ACT inhaler Inhale 2 puffs into the lungs every 4 (four) hours as needed for wheezing or shortness of breath (cough). 1 each Tammara Massing, DO   promethazine -dextromethorphan (PROMETHAZINE -DM) 6.25-15 MG/5ML syrup Take 5 mLs by mouth 4 (four) times daily as needed. 118 mL Amye Grego, DO      PDMP not reviewed this encounter.   Kriste Berth, DO 03/09/24 1531

## 2024-04-25 ENCOUNTER — Ambulatory Visit
Admission: EM | Admit: 2024-04-25 | Discharge: 2024-04-25 | Disposition: A | Discharge status: Home / Self Care | Payer: Self-pay | Attending: Emergency Medicine | Admitting: Emergency Medicine

## 2024-04-25 ENCOUNTER — Encounter: Payer: Self-pay | Admitting: Emergency Medicine

## 2024-04-25 DIAGNOSIS — J069 Acute upper respiratory infection, unspecified: Secondary | ICD-10-CM | POA: Insufficient documentation

## 2024-04-25 LAB — RESP PANEL BY RT-PCR (FLU A&B, COVID) ARPGX2
Influenza A by PCR: NEGATIVE
Influenza B by PCR: NEGATIVE
SARS Coronavirus 2 by RT PCR: NEGATIVE

## 2024-04-25 MED ORDER — BENZONATATE 100 MG PO CAPS: 200.0000 mg | ORAL_CAPSULE | Freq: Three times a day (TID) | ORAL | 0 refills | Status: DC

## 2024-04-25 MED ORDER — PROMETHAZINE-DM 6.25-15 MG/5ML PO SYRP: 5.0000 mL | ORAL_SOLUTION | Freq: Four times a day (QID) | ORAL | 0 refills | Status: DC | PRN

## 2024-04-25 MED ORDER — IPRATROPIUM BROMIDE 0.06 % NA SOLN: 2.0000 | Freq: Four times a day (QID) | NASAL | 12 refills | Status: DC

## 2024-04-25 NOTE — ED Provider Notes (Signed)
 [facility]    CSN: [id number] Arrival date & time: 04/25/24  1556      History   Chief Complaint Chief Complaint  Patient presents with   Cough    HPI Becky Dunlap is a 38 y.o. female.   HPI  38 year old female with past medical history significant for anxiety presents for evaluation of 2 days worth of respiratory symptoms to include headache, body aches, chills, runny nose nasal congestion, and a nonproductive cough.  She denies any fever, short breath, or wheezing.  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  She denies recent travel out of state or country.  Past Medical History:  Diagnosis Date   Anxiety    on celexa   SVD (spontaneous vaginal delivery) 11/07/2011    Patient Active Problem List   Diagnosis Date Noted   SVD (spontaneous vaginal delivery) 11/07/2011    Past Surgical History:  Procedure Laterality Date   DILATION AND CURETTAGE OF UTERUS  2009    OB History     Gravida  6   Para  1   Term  1   Preterm  0   AB  1   Living  1      SAB  1   IAB  0   Ectopic  0   Multiple  0   Live Births  1            Home Medications    Prior to Admission medications   Medication Sig Start Date End Date Taking? Authorizing Provider  benzonatate  (TESSALON ) 100 MG capsule Take 2 capsules (200 mg total) by mouth every 8 (eight) hours. 04/25/24  Yes Bernardino Ditch, NP  ipratropium (ATROVENT) 0.06 % nasal spray Place 2 sprays into both nostrils 4 (four) times daily. 04/25/24  Yes Bernardino Ditch, NP  albuterol  (VENTOLIN  HFA) 108 (90 Base) MCG/ACT inhaler Inhale 2 puffs into the lungs every 4 (four) hours as needed for wheezing or shortness of breath (cough). 03/09/24   Brimage, Vondra, DO  famotidine  (PEPCID ) 40 MG/5ML suspension Take 2.5 mLs (20 mg total) by mouth 2 (two) times daily. 01/27/24   Bernardino Ditch, NP  fluconazole  (DIFLUCAN ) 150 MG tablet Take 1 tab po q72 hr  prn yeast infection 02/09/24   Arvis Jolan NOVAK, PA-C  montelukast  (SINGULAIR ) 10 MG tablet Take 1 tablet (10 mg total) by mouth at bedtime. 07/26/17   Arloa Ozell BIRCH, PA-C  omeprazole  (PRILOSEC) 40 MG capsule Take 1 capsule (40 mg total) by mouth daily. 01/27/24   Bernardino Ditch, NP  promethazine -dextromethorphan (PROMETHAZINE -DM) 6.25-15 MG/5ML syrup Take 5 mLs by mouth 4 (four) times daily as needed. 04/25/24   Bernardino Ditch, NP  cimetidine (CIMETIDINE 200) 200 MG tablet Take 200 mg by mouth 4 (four) times daily.  04/29/19  [provider]    Family History Family History  Problem Relation Age of Onset   Cancer Mother    Anesthesia problems Neg Hx     Social History Social History   Tobacco Use   Smoking status: Former    Types: Cigarettes   Smokeless tobacco: Never  Vaping Use   Vaping status: Never Used  Substance Use Topics   Alcohol use: Not Currently   Drug use: No     Allergies   Patient has no known allergies.   Review of Systems Review of Systems  Constitutional:  Positive for chills. Negative for fever.  HENT:  Positive for congestion and rhinorrhea. Negative for ear pain.   Respiratory:  Positive for cough. Negative for shortness of breath and wheezing.   Musculoskeletal:  Positive for arthralgias and myalgias.  Neurological:  Positive for headaches.     Physical Exam Triage Vital Signs ED Triage Vitals  Encounter Vitals Group     BP      Girls Systolic BP Percentile      Girls Diastolic BP Percentile      Boys Systolic BP Percentile      Boys Diastolic BP Percentile      Pulse      Resp      Temp      Temp src      SpO2      Weight      Height      Head Circumference      Peak Flow      Pain Score      Pain Loc      Pain Education      Exclude from Growth Chart    No data found.  Updated Vital Signs BP (!) 138/98 (BP Location: Left Wrist)   Pulse (!) 101   Temp 98.8 F (37.1 C) (Oral)   Resp 20   Wt 220 lb (99.8 kg)   SpO2  99%   BMI 35.51 kg/m   Visual Acuity Right Eye Distance:   Left Eye Distance:   Bilateral Distance:    Right Eye Near:   Left Eye Near:    Bilateral Near:     Physical Exam Vitals and nursing note reviewed.  Constitutional:      Appearance: Normal appearance. She is not ill-appearing.  HENT:     Head: Normocephalic and atraumatic.     Right Ear: Tympanic membrane, ear canal and external ear normal. There is no impacted cerumen.     Left Ear: Tympanic membrane, ear canal and external ear normal. There is no impacted cerumen.     Nose: Congestion and rhinorrhea present.     Comments: This mucosa is edematous and erythematous with clear discharge in both nares.    Mouth/Throat:     Mouth: Mucous membranes are moist.     Pharynx: Oropharynx is clear. No oropharyngeal exudate or posterior oropharyngeal erythema.  Cardiovascular:     Rate and Rhythm: Normal rate and regular rhythm.     Pulses: Normal pulses.     Heart sounds: Normal heart sounds. No murmur heard.    No friction rub. No gallop.  Pulmonary:     Effort: Pulmonary effort is normal.     Breath sounds: Normal breath sounds. No wheezing, rhonchi or rales.  Skin:    General: Skin is warm and dry.     Capillary Refill: Capillary refill takes less than 2 seconds.     Findings: No rash.  Neurological:     General: No focal deficit present.     Mental Status: She is alert and oriented to person, place, and time.      UC Treatments / Results  Labs (all labs ordered are listed, but only abnormal results are displayed) Labs Reviewed  RESP PANEL BY RT-PCR (FLU A&B, COVID) [id number]    EKG   Radiology No results found.  Procedures Procedures (including critical care time)  Medications Ordered in UC Medications - No data to display  Initial Impression / Assessment and Plan / UC Course  I have reviewed the triage vital signs and the nursing notes.  Pertinent labs & imaging results that were available during my  care of the patient were reviewed by me and considered in my medical decision making (see chart for details).   Patient is a very pleasant, nontoxic-appearing 38 year old female presenting for evaluation 2 days with a flulike symptoms outlined HPI above.  She reports that her mother significant symptoms are the chills.  She reports that when she becomes chilled she has a pins-and-needles sensation all over her body.  She is experiencing clear rhinorrhea and a nonproductive cough but no shortness of breath or wheezing.  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  She has not been running a fever.  Differential diagnose include COVID, influenza, viral respiratory infection.  I will order a COVID and flu PCR.  Respiratory panel is negative for COVID and influenza.  I will discharge patient on the diagnosis of viral URI with a cough.  I will prescribe Atrovent nasal spray for nasal congestion.  Tessalon  Perles and Promethazine  DM cough syrup for cough and congestion.  She may use over-the-counter Tylenol  and/or ibuprofen  as needed for any fever or pain.  Return precautions reviewed.  Work note provided.   Final Clinical Impressions(s) / UC Diagnoses   Final diagnoses:  Viral URI with cough     Discharge Instructions      Your testing today was negative for COVID or influenza but your exam is consistent with a viral upper respiratory tract infection.  Use over-the-counter Tylenol  and/or ibuprofen  as needed for any fever or pain.Use the Atrovent nasal spray, 2 squirts in each nostril every 6 hours, as needed for runny nose and postnasal drip.  Use the Tessalon  Perles every 8 hours during the day.  Take them with a small sip of water.  They may give you some numbness to the base of your tongue or a metallic taste in your mouth, this is normal.  Use the Promethazine  DM cough syrup at bedtime for cough and congestion.  It will make you  drowsy so do not take it during the day.  Return for reevaluation or see your primary care provider for any new or worsening symptoms.      ED Prescriptions     Medication Sig Dispense Auth. Provider   promethazine -dextromethorphan (PROMETHAZINE -DM) 6.25-15 MG/5ML syrup Take 5 mLs by mouth 4 (four) times daily as needed. 118 mL Bernardino Ditch, NP   benzonatate  (TESSALON ) 100 MG capsule Take 2 capsules (200 mg total) by mouth every 8 (eight) hours. 21 capsule Bernardino Ditch, NP   ipratropium (ATROVENT) 0.06 % nasal spray Place 2 sprays into both nostrils 4 (four) times daily. 15 mL Bernardino Ditch, NP      PDMP not reviewed this encounter.   Bernardino Ditch, NP 04/25/24 1718

## 2024-04-25 NOTE — Discharge Instructions (Signed)
 Your testing today was negative for COVID or influenza but your exam is consistent with a viral upper respiratory tract infection.  Use over-the-counter Tylenol  and/or ibuprofen  as needed for any fever or pain.Use the Atrovent nasal spray, 2 squirts in each nostril every 6 hours, as needed for runny nose and postnasal drip.  Use the Tessalon  Perles every 8 hours during the day.  Take them with a small sip of water.  They may give you some numbness to the base of your tongue or a metallic taste in your mouth, this is normal.  Use the Promethazine  DM cough syrup at bedtime for cough and congestion.  It will make you drowsy so do not take it during the day.  Return for reevaluation or see your primary care provider for any new or worsening symptoms.

## 2024-04-25 NOTE — ED Triage Notes (Signed)
 Sx x 2 days  Chills Headache Bodyaches Cough

## 2024-06-09 ENCOUNTER — Ambulatory Visit (INDEPENDENT_AMBULATORY_CARE_PROVIDER_SITE_OTHER)

## 2024-06-09 VITALS — BP 107/57 | HR 98 | Resp 16 | Ht 66.0 in | Wt 228.0 lb

## 2024-06-09 DIAGNOSIS — M722 Plantar fascial fibromatosis: Secondary | ICD-10-CM | POA: Diagnosis not present

## 2024-06-09 DIAGNOSIS — K449 Diaphragmatic hernia without obstruction or gangrene: Secondary | ICD-10-CM | POA: Diagnosis not present

## 2024-06-09 DIAGNOSIS — J4520 Mild intermittent asthma, uncomplicated: Secondary | ICD-10-CM | POA: Insufficient documentation

## 2024-06-09 DIAGNOSIS — F419 Anxiety disorder, unspecified: Secondary | ICD-10-CM | POA: Diagnosis not present

## 2024-06-09 DIAGNOSIS — Z1231 Encounter for screening mammogram for malignant neoplasm of breast: Secondary | ICD-10-CM

## 2024-06-09 DIAGNOSIS — K219 Gastro-esophageal reflux disease without esophagitis: Secondary | ICD-10-CM | POA: Diagnosis not present

## 2024-06-09 DIAGNOSIS — F322 Major depressive disorder, single episode, severe without psychotic features: Secondary | ICD-10-CM | POA: Insufficient documentation

## 2024-06-09 MED ORDER — NAPROXEN 500 MG PO TABS: 500.0000 mg | ORAL_TABLET | Freq: Two times a day (BID) | ORAL | 0 refills | Status: AC | PRN

## 2024-06-09 MED ORDER — OMEPRAZOLE 40 MG PO CPDR: 40.0000 mg | DELAYED_RELEASE_CAPSULE | Freq: Every day | ORAL | 3 refills | Status: AC

## 2024-06-09 MED ORDER — PROPRANOLOL HCL 10 MG PO TABS: 10.0000 mg | ORAL_TABLET | Freq: Three times a day (TID) | ORAL | 3 refills | Status: DC | PRN

## 2024-06-09 MED ORDER — SERTRALINE HCL 50 MG PO TABS: 50.0000 mg | ORAL_TABLET | Freq: Every day | ORAL | 3 refills | Status: DC

## 2024-06-09 NOTE — Progress Notes (Signed)
 New patient visit   Patient: Becky Dunlap   DOB: [birth year]   38 y.o. Female  MRN: [id number] Visit Date: 06/09/2024  Today's healthcare provider: Isaiah DELENA Pepper, MD   Chief Complaint  Patient presents with   New Patient (Initial Visit)    NP/Est Care/Lheel and leg pain( 2 months) Problems focusing .Pt wants a referral to GI and Ortho( due to foot problem)   Subjective    Becky Dunlap is a 38 y.o. female who presents today as a new patient to establish care.   Discussed the use of AI scribe software for clinical note transcription with the patient, who gave verbal consent to proceed.  History of Present Illness Becky Dunlap is a 38 year old female who presents with heel pain.  She has pain in her right heel, most severe in the center of the heel. The pain worsens throughout the day, especially after periods of sitting, and she finds it difficult to apply pressure on the heel. She has tried changing her shoes and using warm and cold compresses, but these measures have not alleviated the pain. She uses Tylenol  for pain relief, which provides some relief at home but not at work, where she is on her feet all day.  She has a history of a moderate-sized hiatal hernia diagnosed a few years ago. She has been trying to lose weight by walking more frequently. She uses omeprazole  as needed for acid reflux associated with the hernia, taking it two to three times a week depending on her diet. She reports increased bloating, particularly during her menstrual cycle, but no significant heartburn recently.  She has a history of asthma, for which she uses an albuterol  inhaler as needed, though she has not needed it in over a year. She does not use Pepcid  anymore as she did not like it.  She is experiencing significant emotional distress, feeling overwhelmed and anxious, which affects her ability to function at work and at home. She reports difficulty focusing, feeling  extremely anxious, and having episodes where she feels unable to get out of bed. She has sought help from [facility] services and called a suicide hotline for support, though she denies any current suicidal ideation. She has previously tried Wellbutrin for mental health but found it unhelpful.   Past Medical History:  Diagnosis Date   Anxiety    on celexa   SVD (spontaneous vaginal delivery) 11/07/2011   Past Surgical History:  Procedure Laterality Date   DILATION AND CURETTAGE OF UTERUS  2009   Family Status  Relation Name Status   Mother  Deceased   Father  Deceased   Neg Hx  (Not Specified)  No partnership data on file   Family History  Problem Relation Age of Onset   Cancer Mother    Anesthesia problems Neg Hx    Social History   Socioeconomic History   Marital status: Single    Spouse name: Not on file   Number of children: Not on file   Years of education: Not on file   Highest education level: Associate degree: academic program  Occupational History   Not on file  Tobacco Use   Smoking status: Former    Types: Cigarettes   Smokeless tobacco: Never  Vaping Use   Vaping status: Never Used  Substance and Sexual Activity   Alcohol use: Not Currently   Drug use: No   Sexual activity: Yes    Birth control/protection: None  Comment: Last encounter: Last month.  Other Topics Concern   Not on file  Social History Narrative   Not on file   Social Drivers of Health   Tobacco Use: Medium Risk (06/09/2024)   Patient History    Smoking Tobacco Use: Former    Smokeless Tobacco Use: Never    Passive Exposure: Not on file  Financial Resource Strain: Medium Risk (06/09/2024)   Overall Financial Resource Strain (CARDIA)    Difficulty of Paying Living Expenses: Somewhat hard  Food Insecurity: Food Insecurity Present (06/09/2024)   Epic    Worried About Programme Researcher, Broadcasting/film/video in the Last Year: Sometimes true    Ran Out of Food in the Last Year: Sometimes true   Transportation Needs: No Transportation Needs (06/09/2024)   Epic    Lack of Transportation (Medical): No    Lack of Transportation (Non-Medical): No  Physical Activity: Insufficiently Active (06/09/2024)   Exercise Vital Sign    Days of Exercise per Week: 3 days    Minutes of Exercise per Session: 20 min  Stress: Stress Concern Present (06/09/2024)   [employer] of Occupational Health - Occupational Stress Questionnaire    Feeling of Stress: To some extent  Social Connections: Moderately Isolated (06/09/2024)   Social Connection and Isolation Panel    Frequency of Communication with Friends and Family: Three times a week    Frequency of Social Gatherings with Friends and Family: Once a week    Attends Religious Services: More than 4 times per year    Active Member of Clubs or Organizations: No    Attends Engineer, Structural: Not on file    Marital Status: Never married  Depression (PHQ2-9): High Risk (06/09/2024)   Depression (PHQ2-9)    PHQ-2 Score: 16  Alcohol Screen: Low Risk (06/09/2024)   Alcohol Screen    Last Alcohol Screening Score (AUDIT): 1  Housing: Unknown (06/09/2024)   Epic    Unable to Pay for Housing in the Last Year: No    Number of Times Moved in the Last Year: Not on file    Homeless in the Last Year: No  Utilities: Not on file  Health Literacy: Not on file   Show/hide medication list[1] Allergies[2]  Reviews of Systems as noted in HPI.      Objective    BP (!) 107/57 (BP Location: Left Arm, Patient Position: Sitting, Cuff Size: Normal)   Pulse 98   Resp 16   Ht 5' 6 (1.676 m)   Wt 228 lb (103.4 kg)   SpO2 100%   BMI 36.80 kg/m     Physical Exam Constitutional:      Appearance: Normal appearance.  HENT:     Head: Normocephalic and atraumatic.     Mouth/Throat:     Mouth: Mucous membranes are moist.  Eyes:     Pupils: Pupils are equal, round, and reactive to light.  Pulmonary:     Effort: Pulmonary effort is normal.   Skin:    General: Skin is warm.  Neurological:     General: No focal deficit present.     Mental Status: She is alert.     Depression Screen    06/09/2024    8:41 AM  PHQ 2/9 Scores  PHQ - 2 Score 6  PHQ- 9 Score 16   No results found for any visits on 06/09/24.  Assessment & Plan      Problem List Items Addressed This Visit  Respiratory   Mild intermittent asthma without complication   Paraesophageal hernia     Digestive   Gastroesophageal reflux disease   Relevant Medications   omeprazole  (PRILOSEC) 40 MG capsule     Musculoskeletal and Integument   Plantar fasciitis of right foot   Relevant Medications   naproxen (NAPROSYN) 500 MG tablet     Other   Depression, major, single episode, severe (HCC) - Primary   Relevant Medications   sertraline (ZOLOFT) 50 MG tablet   Other Relevant Orders   Amb ref to Integrated Behavioral Health   Anxiety   Relevant Medications   sertraline (ZOLOFT) 50 MG tablet   propranolol (INDERAL) 10 MG tablet   Other Visit Diagnoses       Screening mammogram for breast cancer       Relevant Orders   MM 3D SCREENING MAMMOGRAM BILATERAL BREAST      Assessment & Plan Plantar fasciitis of right foot Heel pain consistent with plantar fasciitis, likely due to increased activity and prolonged standing. - Prescribed naproxen 500 mg twice daily for one week, then as needed. - Recommended icing and alternating warm/cold compressions. - Provided plantar fascia stretching exercises. - Suggested heel cups for shoe cushioning. - Advised podiatry referral if no improvement.  Major depressive disorder, severe (HCC) Anxiety Chronic, uncontrolled. Significant depression and anxiety symptoms with functional impairment. Previous Wellbutrin trial ineffective. Deniessuicidal ideation. - Prescribed sertraline 50mg  - Prescribed propranolol for acute anxiety, up to three times daily. - Referred to behavioral health specialist for  counseling. - Scheduled follow-up in one month to assess treatment response.  Gastroesophageal reflux disease Paraesophageal hernia GERD with paraesophageal hernia, managed with omeprazole . Symptoms include bloating, heartburn not severe. Discussed daily omeprazole  use. - Continue daily omeprazole . - Discussed potential need for surgery, patient would like to defer this at this time  Mild Intermittent Asthma Chronic, controlled. Patient has albuterol  to use as needed, has not needed in >1 year.  General health maintenance Due for initial mammogram screening at age 50. - Referred for mammogram screening at [facility].   Patient/Guardian was advised Release of Information must be obtained prior to any record release in order to collaborate their care with an outside provider. Patient/Guardian was advised if they have not already done so to contact the registration department to sign all necessary forms in order for us  to release information regarding their care.   Consent: Patient/Guardian gives verbal consent for treatment and assignment of benefits for services provided during this visit. Patient/Guardian expressed understanding and agreed to proceed.     Return in about 4 weeks (around 07/07/2024) for Mental Health Follow Up.      Isaiah DELENA Pepper, MD  [facility] [phone] (phone) [phone] (fax)     [1]  Outpatient Medications Prior to Visit  Medication Sig   albuterol  (VENTOLIN  HFA) 108 (90 Base) MCG/ACT inhaler Inhale 2 puffs into the lungs every 4 (four) hours as needed for wheezing or shortness of breath (cough).   [DISCONTINUED] benzonatate  (TESSALON ) 100 MG capsule Take 2 capsules (200 mg total) by mouth every 8 (eight) hours.   [DISCONTINUED] famotidine  (PEPCID ) 40 MG/5ML suspension Take 2.5 mLs (20 mg total) by mouth 2 (two) times daily.   [DISCONTINUED] fluconazole  (DIFLUCAN ) 150 MG tablet Take 1 tab po q72 hr prn yeast  infection   [DISCONTINUED] ipratropium (ATROVENT ) 0.06 % nasal spray Place 2 sprays into both nostrils 4 (four) times daily.   [DISCONTINUED] montelukast  (SINGULAIR ) 10 MG  tablet Take 1 tablet (10 mg total) by mouth at bedtime.   [DISCONTINUED] omeprazole  (PRILOSEC) 40 MG capsule Take 1 capsule (40 mg total) by mouth daily.   [DISCONTINUED] promethazine -dextromethorphan (PROMETHAZINE -DM) 6.25-15 MG/5ML syrup Take 5 mLs by mouth 4 (four) times daily as needed.   No facility-administered medications prior to visit.  [2] No Known Allergies

## 2024-07-05 ENCOUNTER — Telehealth: Payer: Self-pay

## 2024-07-05 NOTE — Telephone Encounter (Signed)
 Please let patient know that she should stop the propranolol  and continue Zoloft 50mg  (1 tablet daily) for now.

## 2024-07-05 NOTE — Telephone Encounter (Signed)
 Copied from CRM [id number]. Topic: Clinical - Medication Question >> Jul 05, 2024 12:37 PM Delon T wrote: Reason for CRM: propranolol  (INDERAL ) 10 MG tablet- patient is not liking the medication, it makes her feel to sleepy and she is having to take 2 sertraline (ZOLOFT) 50 MG tablet instead-  [phone]

## 2024-07-05 NOTE — Telephone Encounter (Signed)
 Pt advised. Verbalized understanding.

## 2024-07-05 NOTE — Telephone Encounter (Signed)
 Copied from CRM [id number]. Topic: Clinical - Medication Refill >> Jul 05, 2024 12:35 PM Delon T wrote: Medication: sertraline (ZOLOFT) 50 MG tablet  Has the patient contacted their pharmacy? No (Agent: If no, request that the patient contact the pharmacy for the refill. If patient does not wish to contact the pharmacy document the reason why and proceed with request.) (Agent: If yes, when and what did the pharmacy advise?)  This is the patient's preferred pharmacy:  [id number] [place] - [address] AT [facility] [address] [place] [address] Phone: [phone] Fax: [phone]    Is this the correct pharmacy for this prescription? Yes If no, delete pharmacy and type the correct one.   Has the prescription been filled recently? Yes  Is the patient out of the medication? NO  Has the patient been seen for an appointment in the last year OR does the patient have an upcoming appointment? Yes  Can we respond through MyChart? Yes  Agent: Please be advised that Rx refills may take up to 3 business days. We ask that you follow-up with your pharmacy.

## 2024-07-12 ENCOUNTER — Ambulatory Visit

## 2024-07-12 VITALS — BP 119/82 | HR 84 | Temp 98.6°F | Wt 229.7 lb

## 2024-07-12 DIAGNOSIS — R41840 Attention and concentration deficit: Secondary | ICD-10-CM | POA: Diagnosis not present

## 2024-07-12 DIAGNOSIS — F419 Anxiety disorder, unspecified: Secondary | ICD-10-CM | POA: Diagnosis not present

## 2024-07-12 DIAGNOSIS — M722 Plantar fascial fibromatosis: Secondary | ICD-10-CM

## 2024-07-12 DIAGNOSIS — F322 Major depressive disorder, single episode, severe without psychotic features: Secondary | ICD-10-CM

## 2024-07-12 MED ORDER — SERTRALINE HCL 100 MG PO TABS: 100.0000 mg | ORAL_TABLET | Freq: Every day | ORAL | 3 refills | Status: AC

## 2024-07-12 NOTE — Patient Instructions (Addendum)
 [facility]  [address]?[place], [place] [address] Phone: [phone] Fax: [phone] [email or web address]   [address] [facility] [place],  [place]  [address] Get Driving Directions Main: [phone]

## 2024-07-12 NOTE — Progress Notes (Signed)
 "    Established patient visit   Patient: Becky Dunlap   DOB: 01/06/1986   39 y.o. Female  MRN: [id number] Visit Date: 07/12/2024  Today's healthcare provider: Isaiah DELENA Pepper, MD   Chief Complaint  Patient presents with   Follow-up    Having a hard time focusing at work. Pain Is still in her right foot   Subjective    HPI  Discussed the use of AI scribe software for clinical note transcription with the patient, who gave verbal consent to proceed.  History of Present Illness Becky Dunlap is a 39 year old female who presents with difficulty focusing and foot pain.  She experiences significant difficulty focusing, describing it as being easily distracted due to a high workload and understaffing at her workplace. She has a history of using Adderall during her high school years, which she found helpful for focus, but discontinued after becoming pregnant with her daughter. She feels overwhelmed by the demands of her job.  She has been taking sertraline  and reports a positive change in managing her anxiety. She previously tried propranolol  but discontinued it due to excessive fatigue at work.  She reports ongoing foot pain, particularly worsening by the afternoon, which causes her to limp. She has been using naproxen  for pain relief and finds it somewhat helpful. She has tried different shoes and insoles but continues to experience pain. She performs exercises and uses heat and cold therapy at home, which provides temporary relief.  She received her flu vaccine in November 2025 at her workplace. She has not yet scheduled a Pap smear or followed up on a mammogram referral. She has not been contacted by behavioral health services despite a previous referral.   Medications: Show/hide medication list[1]  Review of Systems as noted in HPI.      Objective    BP 119/82   Pulse 84   Temp 98.6 F (37 C) (Oral)   Wt 229 lb 11.2 oz (104.2 kg)   LMP 07/07/2024   BMI  37.07 kg/m    Physical Exam Constitutional:      Appearance: Normal appearance.  HENT:     Head: Normocephalic and atraumatic.     Mouth/Throat:     Mouth: Mucous membranes are moist.  Eyes:     Pupils: Pupils are equal, round, and reactive to light.  Pulmonary:     Effort: Pulmonary effort is normal.  Skin:    General: Skin is warm.  Neurological:     General: No focal deficit present.     Mental Status: She is alert.      No results found for any visits on 07/12/24.  Assessment & Plan     Problem List Items Addressed This Visit       Musculoskeletal and Integument   Plantar fasciitis of right foot - Primary   Relevant Orders   Ambulatory referral to Podiatry     Other   Depression, major, single episode, severe (HCC)   Relevant Medications   sertraline  (ZOLOFT ) 100 MG tablet   Anxiety   Relevant Medications   sertraline  (ZOLOFT ) 100 MG tablet   Inattention   Relevant Orders   Ambulatory referral to Psychiatry    Assessment & Plan Major depressive disorder, single episode, severe (HCC) Anxiety Chronic, improved since starting sertraline . Difficulty focusing and being easily distracted, possibly related to anxiety or attention deficit.  - Increased sertraline  to 100 mg daily. - Sent prescription for 100 mg sertraline  to [facility] in  [place].  Inattention Chronic. Difficulty focusing and being easily distracted. Previously was diagnosed with ADHD as a child and benefited from Adderall. Has not been on medication for years. Discussed increasing sertraline  before considering stimulants. - Increased sertraline  to 100 mg daily. - Referred to [facility] for evaluation of ADHD and potential medication management.  Plantar fasciitis of right foot Heel pain consistent with plantar fasciitis. Naproxen  provides some relief. Discussed custom insoles and podiatry referral. - Referred to podiatry for evaluation - Continue naproxen  as needed for pain  relief. - Continue exercises and alternating heat and cold therapy.  General Health Maintenance Received flu vaccine in November. Last Pap smear in 2020. Mammogram referral sent but not followed up. - Schedule Pap smear. - Follow up on mammogram referral.      Return in about 6 weeks (around 08/23/2024) for Follow Up.       Isaiah DELENA Pepper, MD  [facility] [phone] (phone) [phone] (fax)     [1]  Outpatient Medications Prior to Visit  Medication Sig   albuterol  (VENTOLIN  HFA) 108 (90 Base) MCG/ACT inhaler Inhale 2 puffs into the lungs every 4 (four) hours as needed for wheezing or shortness of breath (cough).   naproxen  (NAPROSYN ) 500 MG tablet Take 1 tablet (500 mg total) by mouth 2 (two) times daily as needed for moderate pain (pain score 4-6).   omeprazole  (PRILOSEC) 40 MG capsule Take 1 capsule (40 mg total) by mouth daily.   [DISCONTINUED] sertraline  (ZOLOFT ) 50 MG tablet Take 1 tablet (50 mg total) by mouth daily.   [DISCONTINUED] propranolol  (INDERAL ) 10 MG tablet Take 1 tablet (10 mg total) by mouth 3 (three) times daily as needed (panic attacks). (Patient not taking: Reported on 07/12/2024)   No facility-administered medications prior to visit.   "

## 2024-08-23 ENCOUNTER — Ambulatory Visit
